# Patient Record
Sex: Female | Born: 1955 | Race: Black or African American | Hispanic: No | Smoking: Never smoker
Health system: Southern US, Community
[De-identification: ages and names within clinical notes are randomized; demographics above are authoritative.]

## PROBLEM LIST (undated history)

## (undated) DIAGNOSIS — I10 Essential (primary) hypertension: Secondary | ICD-10-CM

## (undated) DIAGNOSIS — E7800 Pure hypercholesterolemia, unspecified: Secondary | ICD-10-CM

## (undated) DIAGNOSIS — J45909 Unspecified asthma, uncomplicated: Secondary | ICD-10-CM

## (undated) DIAGNOSIS — R55 Syncope and collapse: Secondary | ICD-10-CM

## (undated) HISTORY — DX: Syncope and collapse: R55

---

## 2012-04-25 ENCOUNTER — Emergency Department (HOSPITAL_COMMUNITY)
Admission: EM | Admit: 2012-04-25 | Discharge: 2012-04-25 | Disposition: A | Discharge status: Home / Self Care | Payer: Self-pay | Attending: Emergency Medicine | Admitting: Emergency Medicine

## 2012-04-25 ENCOUNTER — Encounter (HOSPITAL_COMMUNITY): Payer: Self-pay | Admitting: *Deleted

## 2012-04-25 DIAGNOSIS — I10 Essential (primary) hypertension: Secondary | ICD-10-CM | POA: Insufficient documentation

## 2012-04-25 DIAGNOSIS — E7800 Pure hypercholesterolemia, unspecified: Secondary | ICD-10-CM | POA: Insufficient documentation

## 2012-04-25 DIAGNOSIS — Z79899 Other long term (current) drug therapy: Secondary | ICD-10-CM | POA: Insufficient documentation

## 2012-04-25 DIAGNOSIS — J45909 Unspecified asthma, uncomplicated: Secondary | ICD-10-CM | POA: Insufficient documentation

## 2012-04-25 DIAGNOSIS — Z760 Encounter for issue of repeat prescription: Secondary | ICD-10-CM | POA: Insufficient documentation

## 2012-04-25 HISTORY — DX: Unspecified asthma, uncomplicated: J45.909

## 2012-04-25 HISTORY — DX: Pure hypercholesterolemia, unspecified: E78.00

## 2012-04-25 HISTORY — DX: Essential (primary) hypertension: I10

## 2012-04-25 MED ORDER — LISINOPRIL-HYDROCHLOROTHIAZIDE 10-12.5 MG PO TABS: 1.0000 | ORAL_TABLET | Freq: Every day | ORAL | Status: DC

## 2012-04-25 NOTE — ED Notes (Signed)
Pt saw PCP today and was told that her BP was elevated. Pt has been without BP medication for a month. Pt states that she normally gets hypertension when she is off her medications. Pt PCP did not refill her BP medication but told her to come to the E.D. Pt is alert and oriented, no neuro deficits able to follow commands and move all extremities.

## 2012-04-25 NOTE — ED Provider Notes (Signed)
History    57 year old female who was apparently referred to the emergency room for evaluation of hypertension. She has been without her blood pressure medications for approximately one month. She takes lisinopril/hydrochlorothiazide. She has no complaints. Denies any headaches. No acute visual changes. No confusion. No numbness, tingling or loss of strength. No chest pain or shortness of breath. No urinary complaints. No unusual leg pain or swelling.  CSN: [id number]  Arrival date & time 04/25/12  4098   First MD Initiated Contact with Patient 04/25/12 2014      Chief Complaint  Patient presents with  . Hypertension  . Medication Refill    (Consider location/radiation/quality/duration/timing/severity/associated sxs/prior treatment) HPI  Past Medical History  Diagnosis Date  . Hypertension   . Asthma   . High cholesterol     Past Surgical History  Procedure Laterality Date  . Cesarean section      History reviewed. No pertinent family history.  History  Substance Use Topics  . Smoking status: Never Smoker   . Smokeless tobacco: Not on file  . Alcohol Use: No    OB History   Grav Para Term Preterm Abortions TAB SAB Ect Mult Living                  Review of Systems  All systems reviewed and negative, other than as noted in HPI.  Allergies  Review of patient's allergies indicates no known allergies.  Home Medications   Current Outpatient Rx  Name  Route  Sig  Dispense  Refill  . albuterol (PROVENTIL HFA;VENTOLIN HFA) 108 (90 BASE) MCG/ACT inhaler   Inhalation   Inhale 2 puffs into the lungs every 6 (six) hours as needed for wheezing. For shortness of breath         . LISINOPRIL PO   Oral   Take 1 tablet by mouth daily.         Marland Kitchen PRAVASTATIN SODIUM PO   Oral   Take 1 tablet by mouth at bedtime.           BP 208/95  Pulse 91  Temp(Src) 98.3 F (36.8 C) (Oral)  Resp 16  SpO2 96%  Physical Exam  Nursing note and vitals  reviewed. Constitutional: She appears well-developed and well-nourished. No distress.  HENT:  Head: Normocephalic and atraumatic.  Eyes: Conjunctivae are normal. Right eye exhibits no discharge. Left eye exhibits no discharge.  Neck: Neck supple.  Cardiovascular: Normal rate, regular rhythm and normal heart sounds.  Exam reveals no gallop and no friction rub.   No murmur heard. Pulmonary/Chest: Effort normal and breath sounds normal. No respiratory distress.  Abdominal: Soft. She exhibits no distension. There is no tenderness.  Musculoskeletal: She exhibits no edema and no tenderness.  Neurological: She is alert.  Skin: Skin is warm and dry.  Psychiatric: She has a normal mood and affect. Her behavior is normal. Thought content normal.    ED Course  Procedures (including critical care time)  Labs Reviewed - No data to display No results found.   1. Hypertension       MDM  57 year old female with asymptomatic hypertension. No indication for emergent reduction or workup at this time. Patient has been previously on lisinopril and hydrochlorothiazide. She cannot recall the doses though. She was started on a low dose of each. Discussed the need for primary care follow-up.       Raeford Razor, MD 04/27/12 1336

## 2014-01-16 ENCOUNTER — Emergency Department (HOSPITAL_COMMUNITY)
Admission: EM | Admit: 2014-01-16 | Discharge: 2014-01-17 | Disposition: A | Discharge status: Home / Self Care | Payer: Self-pay | Attending: Emergency Medicine | Admitting: Emergency Medicine

## 2014-01-16 ENCOUNTER — Encounter (HOSPITAL_COMMUNITY): Payer: Self-pay | Admitting: Adult Health

## 2014-01-16 DIAGNOSIS — R110 Nausea: Secondary | ICD-10-CM | POA: Insufficient documentation

## 2014-01-16 DIAGNOSIS — I10 Essential (primary) hypertension: Secondary | ICD-10-CM | POA: Insufficient documentation

## 2014-01-16 DIAGNOSIS — J45909 Unspecified asthma, uncomplicated: Secondary | ICD-10-CM | POA: Insufficient documentation

## 2014-01-16 DIAGNOSIS — Z8639 Personal history of other endocrine, nutritional and metabolic disease: Secondary | ICD-10-CM | POA: Insufficient documentation

## 2014-01-16 LAB — CBC
HEMATOCRIT: 37.2 % (ref 36.0–46.0)
Hemoglobin: 12.4 g/dL (ref 12.0–15.0)
MCH: 29.3 pg (ref 26.0–34.0)
MCHC: 33.3 g/dL (ref 30.0–36.0)
MCV: 87.9 fL (ref 78.0–100.0)
Platelets: 269 10*3/uL (ref 150–400)
RBC: 4.23 MIL/uL (ref 3.87–5.11)
RDW: 13.3 % (ref 11.5–15.5)
WBC: 3.8 10*3/uL — ABNORMAL LOW (ref 4.0–10.5)

## 2014-01-16 LAB — BASIC METABOLIC PANEL
Anion gap: 11 (ref 5–15)
BUN: 11 mg/dL (ref 6–23)
CO2: 26 mEq/L (ref 19–32)
CREATININE: 0.55 mg/dL (ref 0.50–1.10)
Calcium: 9.4 mg/dL (ref 8.4–10.5)
Chloride: 100 mEq/L (ref 96–112)
Glucose, Bld: 130 mg/dL — ABNORMAL HIGH (ref 70–99)
POTASSIUM: 3.6 meq/L — AB (ref 3.7–5.3)
Sodium: 137 mEq/L (ref 137–147)

## 2014-01-16 LAB — I-STAT TROPONIN, ED: Troponin i, poc: 0 ng/mL (ref 0.00–0.08)

## 2014-01-16 MED ORDER — LISINOPRIL 10 MG PO TABS
10.0000 mg | ORAL_TABLET | Freq: Once | ORAL | Status: AC
  Administered 2014-01-17: 10 mg via ORAL
  Filled 2014-01-16: qty 1

## 2014-01-16 MED ORDER — HYDROCHLOROTHIAZIDE 12.5 MG PO CAPS
12.5000 mg | ORAL_CAPSULE | Freq: Every day | ORAL | Status: DC
  Administered 2014-01-17: 12.5 mg via ORAL
  Filled 2014-01-16: qty 1

## 2014-01-16 NOTE — ED Provider Notes (Signed)
TIME SEEN: 11:10 PM  CHIEF COMPLAINT: nausea, lightheadedness  HPI: Pt is a 58 y.o. F with h/o HTN, HLD, asthma who presents to the ED with one day of nausea and lightheadedness now gone. She reports she has not been taking her lisinopril and hydrochlorothiazide for 8 months because she lost her job. [place] and does not have a primary care physician. States that she is feeling well currently. No headache, vision changes, chest pain or shortness of breath, numbness, tingling or focal weakness.  ROS: See HPI Constitutional: no fever  Eyes: no drainage  ENT: no runny nose   Cardiovascular:  no chest pain  Resp: no SOB  GI: no vomiting GU: no dysuria Integumentary: no rash  Allergy: no hives  Musculoskeletal: no leg swelling  Neurological: no slurred speech ROS otherwise negative  PAST MEDICAL HISTORY/PAST SURGICAL HISTORY:  Past Medical History  Diagnosis Date  . Hypertension   . Asthma   . High cholesterol     MEDICATIONS:  Prior to Admission medications   Medication Sig Start Date End Date Taking? Authorizing Provider  albuterol (PROVENTIL HFA;VENTOLIN HFA) 108 (90 BASE) MCG/ACT inhaler Inhale 2 puffs into the lungs every 6 (six) hours as needed for wheezing. For shortness of breath    Historical Provider, MD  lisinopril-hydrochlorothiazide (PRINZIDE) 10-12.5 MG per tablet Take 1 tablet by mouth daily. 04/25/12   Raeford RazorStephen Kohut, MD    ALLERGIES:  No Known Allergies  SOCIAL HISTORY:  History  Substance Use Topics  . Smoking status: Never Smoker   . Smokeless tobacco: Not on file  . Alcohol Use: No    FAMILY HISTORY: History reviewed. No pertinent family history.  EXAM: BP 211/103 mmHg  Pulse 71  Temp(Src) 97.8 F (36.6 C) (Oral)  Resp 17  SpO2 100% CONSTITUTIONAL: Alert and oriented and responds appropriately to questions. Well-appearing; well-nourished HEAD: Normocephalic EYES: Conjunctivae clear, PERRL; extraocular  movements intact, normal visual fields, patient has a small inferior and medial subconjunctival hemorrhage, no hyphema, no papilledema appreciated on funduscopic exam ENT: normal nose; no rhinorrhea; moist mucous membranes; pharynx without lesions noted NECK: Supple, no meningismus, no LAD  CARD: RRR; S1 and S2 appreciated; no murmurs, no clicks, no rubs, no gallops RESP: Normal chest excursion without splinting or tachypnea; breath sounds clear and equal bilaterally; no wheezes, no rhonchi, no rales,  ABD/GI: Normal bowel sounds; non-distended; soft, non-tender, no rebound, no guarding BACK:  The back appears normal and is non-tender to palpation, there is no CVA tenderness EXT: Normal ROM in all joints; non-tender to palpation; no edema; normal capillary refill; no cyanosis    SKIN: Normal color for age and race; warm NEURO: Moves all extremities equally; sensation to light touch intact diffusely, cranial nerves II through XII intact PSYCH: The patient's mood and manner are appropriate. Grooming and personal hygiene are appropriate.  MEDICAL DECISION MAKING: Patient here with vague symptoms of lightheadedness and nausea that have now resolved. She reports she is feeling better. She is still hypertensive with blood pressure of 194/106 but is asymptomatic. Will restart her lisinopril and hydrochlorothiazide. Labs ordered from triage show no signs of end organ damage. EKG shows no ischemic changes, LVH. Will obtain urinalysis and closely monitor blood pressure.  ED PROGRESS: Patient's urine shows no hematuria or proteinuria. Labs are unremarkable and show no other sign of end organ damage.  She is still mildly hypertensive asymptomatic. Discussed with patient that we do not need to acutely lower her  blood pressure too low as this is likely what her brain is used to and that we could cause her to have an infarct if we drop her blood pressure too quickly. Discussed with patient that she will need to get  her lisinopril and hydrochlorothiazide filled tomorrow. Discussed with her that both of these medications are on the [facility] $4 list even if she does not have insurance. Will give outpatient PCP follow-up information including [facility] so she can establish a PCP. Have discussed with her at length that it is dangerous to have elevated blood pressure for a significant period of time I recommend that she begin taking her medications regularly. Discussed return precautions. She verbalized understanding and is comfortable with plan.      EKG Interpretation  Date/Time:  Wednesday January 16 2014 21:09:07 EST Ventricular Rate:  73 PR Interval:  146 QRS Duration: 70 QT Interval:  372 QTC Calculation: 409 R Axis:   75 Text Interpretation:  Normal sinus rhythm Normal ECG No old tracing to compare Confirmed by Serria Sloma,  DO, Geralene Afshar [phone]([id number]) on 01/16/2014 11:13:20 PM        Layla MawKristen N Roberto Romanoski, DO 01/17/14 [id number]

## 2014-01-16 NOTE — ED Notes (Signed)
Presents with dizziness and nausea that began today-she has been out of BP medication for a few months and reports the dizziness feels "Like I am drunk"  Denies blurred vision, denies pain denies headache, denies numbness, tingling. Neurologically intact. BP here 202/95

## 2014-01-17 LAB — URINALYSIS, ROUTINE W REFLEX MICROSCOPIC
BILIRUBIN URINE: NEGATIVE
GLUCOSE, UA: NEGATIVE mg/dL
HGB URINE DIPSTICK: NEGATIVE
KETONES UR: NEGATIVE mg/dL
Leukocytes, UA: NEGATIVE
Nitrite: NEGATIVE
PH: 7.5 (ref 5.0–8.0)
Protein, ur: NEGATIVE mg/dL
Specific Gravity, Urine: 1.008 (ref 1.005–1.030)
Urobilinogen, UA: 0.2 mg/dL (ref 0.0–1.0)

## 2014-01-17 MED ORDER — LISINOPRIL 10 MG PO TABS: 10.0000 mg | ORAL_TABLET | Freq: Every day | ORAL | Status: DC

## 2014-01-17 MED ORDER — HYDROCHLOROTHIAZIDE 12.5 MG PO TABS: 12.5000 mg | ORAL_TABLET | Freq: Every day | ORAL | Status: DC

## 2014-01-17 NOTE — Discharge Instructions (Signed)
DASH Eating Plan °DASH stands for "Dietary Approaches to Stop Hypertension." The DASH eating plan is a healthy eating plan that has been shown to reduce high blood pressure (hypertension). Additional health benefits may include reducing the risk of type 2 diabetes mellitus, heart disease, and stroke. The DASH eating plan may also help with weight loss. °WHAT DO I NEED TO KNOW ABOUT THE DASH EATING PLAN? °For the DASH eating plan, you will follow these general guidelines: °· Choose foods with a percent daily value for sodium of less than 5% (as listed on the food label). °· Use salt-free seasonings or herbs instead of table salt or sea salt. °· Check with your health care provider or pharmacist before using salt substitutes. °· Eat lower-sodium products, often labeled as "lower sodium" or "no salt added." °· Eat fresh foods. °· Eat more vegetables, fruits, and low-fat dairy products. °· Choose whole grains. Look for the word "whole" as the first word in the ingredient list. °· Choose fish and skinless chicken or turkey more often than red meat. Limit fish, poultry, and meat to 6 oz (170 g) each day. °· Limit sweets, desserts, sugars, and sugary drinks. °· Choose heart-healthy fats. °· Limit cheese to 1 oz (28 g) per day. °· Eat more home-cooked food and less restaurant, buffet, and fast food. °· Limit fried foods. °· Cook foods using methods other than frying. °· Limit canned vegetables. If you do use them, rinse them well to decrease the sodium. °· When eating at a restaurant, ask that your food be prepared with less salt, or no salt if possible. °WHAT FOODS CAN I EAT? °Seek help from a dietitian for individual calorie needs. °Grains °Whole grain or whole wheat bread. Brown rice. Whole grain or whole wheat pasta. Quinoa, bulgur, and whole grain cereals. Low-sodium cereals. Corn or whole wheat flour tortillas. Whole grain cornbread. Whole grain crackers. Low-sodium crackers. °Vegetables °Fresh or frozen vegetables  (raw, steamed, roasted, or grilled). Low-sodium or reduced-sodium tomato and vegetable juices. Low-sodium or reduced-sodium tomato sauce and paste. Low-sodium or reduced-sodium canned vegetables.  °Fruits °All fresh, canned (in natural juice), or frozen fruits. °Meat and Other Protein Products °Ground beef (85% or leaner), grass-fed beef, or beef trimmed of fat. Skinless chicken or turkey. Ground chicken or turkey. Pork trimmed of fat. All fish and seafood. Eggs. Dried beans, peas, or lentils. Unsalted nuts and seeds. Unsalted canned beans. °Dairy °Low-fat dairy products, such as skim or 1% milk, 2% or reduced-fat cheeses, low-fat ricotta or cottage cheese, or plain low-fat yogurt. Low-sodium or reduced-sodium cheeses. °Fats and Oils °Tub margarines without trans fats. Light or reduced-fat mayonnaise and salad dressings (reduced sodium). Avocado. Safflower, olive, or canola oils. Natural peanut or almond butter. °Other °Unsalted popcorn and pretzels. °The items listed above may not be a complete list of recommended foods or beverages. Contact your dietitian for more options. °WHAT FOODS ARE NOT RECOMMENDED? °Grains °White bread. White pasta. White rice. Refined cornbread. Bagels and croissants. Crackers that contain trans fat. °Vegetables °Creamed or fried vegetables. Vegetables in a cheese sauce. Regular canned vegetables. Regular canned tomato sauce and paste. Regular tomato and vegetable juices. °Fruits °Dried fruits. Canned fruit in light or heavy syrup. Fruit juice. °Meat and Other Protein Products °Fatty cuts of meat. Ribs, chicken wings, bacon, sausage, bologna, salami, chitterlings, fatback, hot dogs, bratwurst, and packaged luncheon meats. Salted nuts and seeds. Canned beans with salt. °Dairy °Whole or 2% milk, cream, half-and-half, and cream cheese. Whole-fat or sweetened yogurt. Full-fat   cheeses or blue cheese. Nondairy creamers and whipped toppings. Processed cheese, cheese spreads, or cheese  curds. °Condiments °Onion and garlic salt, seasoned salt, table salt, and sea salt. Canned and packaged gravies. Worcestershire sauce. Tartar sauce. Barbecue sauce. Teriyaki sauce. Soy sauce, including reduced sodium. Steak sauce. Fish sauce. Oyster sauce. Cocktail sauce. Horseradish. Ketchup and mustard. Meat flavorings and tenderizers. Bouillon cubes. Hot sauce. Tabasco sauce. Marinades. Taco seasonings. Relishes. °Fats and Oils °Butter, stick margarine, lard, shortening, ghee, and bacon fat. Coconut, palm kernel, or palm oils. Regular salad dressings. °Other °Pickles and olives. Salted popcorn and pretzels. °The items listed above may not be a complete list of foods and beverages to avoid. Contact your dietitian for more information. °WHERE CAN I FIND MORE INFORMATION? °National Heart, Lung, and Blood Institute: www.nhlbi.nih.gov/health/health-topics/topics/dash/ °Document Released: 01/14/2011 Document Revised: 06/11/2013 Document Reviewed: 11/29/2012 °ExitCare® Patient Information ©2015 [employer]. This information is not intended to replace advice given to you by your health care provider. Make sure you discuss any questions you have with your health care provider. ° °Hypertension °Hypertension, commonly called high blood pressure, is when the force of blood pumping through your arteries is too strong. Your arteries are the blood vessels that carry blood from your heart throughout your body. A blood pressure reading consists of a higher number over a lower number, such as 110/72. The higher number (systolic) is the pressure inside your arteries when your heart pumps. The lower number (diastolic) is the pressure inside your arteries when your heart relaxes. Ideally you want your blood pressure below 120/80. °Hypertension forces your heart to work harder to pump blood. Your arteries may become narrow or stiff. Having hypertension puts you at risk for heart disease, stroke, and other problems.  °RISK  FACTORS °Some risk factors for high blood pressure are controllable. Others are not.  °Risk factors you cannot control include:  °· Race. You may be at higher risk if you are African American. °· Age. Risk increases with age. °· Gender. Men are at higher risk than women before age 45 years. After age 65, women are at higher risk than men. °Risk factors you can control include: °· Not getting enough exercise or physical activity. °· Being overweight. °· Getting too much fat, sugar, calories, or salt in your diet. °· Drinking too much alcohol. °SIGNS AND SYMPTOMS °Hypertension does not usually cause signs or symptoms. Extremely high blood pressure (hypertensive crisis) may cause headache, anxiety, shortness of breath, and nosebleed. °DIAGNOSIS  °To check if you have hypertension, your health care provider will measure your blood pressure while you are seated, with your arm held at the level of your heart. It should be measured at least twice using the same arm. Certain conditions can cause a difference in blood pressure between your right and left arms. A blood pressure reading that is higher than normal on one occasion does not mean that you need treatment. If one blood pressure reading is high, ask your health care provider about having it checked again. °TREATMENT  °Treating high blood pressure includes making lifestyle changes and possibly taking medicine. Living a healthy lifestyle can help lower high blood pressure. You may need to change some of your habits. °Lifestyle changes may include: °· Following the DASH diet. This diet is high in fruits, vegetables, and whole grains. It is low in salt, red meat, and added sugars. °· Getting at least 2½ hours of brisk physical activity every week. °· Losing weight if necessary. °· Not smoking. °·   Limiting alcoholic beverages. °· Learning ways to reduce stress. ° If lifestyle changes are not enough to get your blood pressure under control, your health care provider may  prescribe medicine. You may need to take more than one. Work closely with your health care provider to understand the risks and benefits. °HOME CARE INSTRUCTIONS °· Have your blood pressure rechecked as directed by your health care provider.   °· Take medicines only as directed by your health care provider. Follow the directions carefully. Blood pressure medicines must be taken as prescribed. The medicine does not work as well when you skip doses. Skipping doses also puts you at risk for problems.   °· Do not smoke.   °· Monitor your blood pressure at home as directed by your health care provider.  °SEEK MEDICAL CARE IF:  °· You think you are having a reaction to medicines taken. °· You have recurrent headaches or feel dizzy. °· You have swelling in your ankles. °· You have trouble with your vision. °SEEK IMMEDIATE MEDICAL CARE IF: °· You develop a severe headache or confusion. °· You have unusual weakness, numbness, or feel faint. °· You have severe chest or abdominal pain. °· You vomit repeatedly. °· You have trouble breathing. °MAKE SURE YOU:  °· Understand these instructions. °· Will watch your condition. °· Will get help right away if you are not doing well or get worse. °Document Released: 01/25/2005 Document Revised: 06/11/2013 Document Reviewed: 11/17/2012 °ExitCare® Patient Information ©2015 [employer]. This information is not intended to replace advice given to you by your health care provider. Make sure you discuss any questions you have with your health care provider. ° °Emergency Department Resource Guide °1) Find a Doctor and Pay Out of Pocket °Although you won't have to find out who is covered by your insurance plan, it is a good idea to ask around and get recommendations. You will then need to call the office and see if the doctor you have chosen will accept you as a new patient and what types of options they offer for patients who are self-pay. Some doctors offer discounts or will set up payment  plans for their patients who do not have insurance, but you will need to ask so you aren't surprised when you get to your appointment. ° °2) Contact Your Local Health Department °Not all health departments have doctors that can see patients for sick visits, but many do, so it is worth a call to see if yours does. If you don't know where your local health department is, you can check in your phone book. The CDC also has a tool to help you locate your state's health department, and many state websites also have listings of all of their local health departments. ° °3) Find a Walk-in Clinic °If your illness is not likely to be very severe or complicated, you may want to try a walk in clinic. These are popping up all over the country in pharmacies, drugstores, and shopping centers. They're usually staffed by nurse practitioners or physician assistants that have been trained to treat common illnesses and complaints. They're usually fairly quick and inexpensive. However, if you have serious medical issues or chronic medical problems, these are probably not your best option. ° °No Primary Care Doctor: °- Call [facility] at  832-8000 - they can help you locate a primary care doctor that  accepts your insurance, provides certain services, etc. °- Physician Referral Service- 1-800-533-3463 ° °Chronic Pain Problems: °Organization           Address  Phone   Notes  °[facility]  (336) [phone] Patients need to be referred by their primary care doctor.  ° °Medication Assistance: °Organization         Address  Phone   Notes  °[place] [facility] [address] °[place], [place] [address] (336) 641-8030 --Must be a resident of [place] °-- Must have NO insurance coverage whatsoever (no Medicaid/ Medicare, etc.) °-- The pt. MUST have a primary care doctor that directs their care regularly and follows them in the community °  °[facility]  (866) 331-1348   °[employer]   (888) 892-1162   ° °Agencies that provide inexpensive medical care: °Organization         Address  Phone   Notes  °[facility]  (336) 832-8035   °[facility]    (336) 832-7272   °[facility] [address] °[place], [place] [address] (336) 832-4777   °[facility] [address], °[place] (336) 271-4999   °[facility]    (336) 373-0678   °[facility]    (336) 272-1050   °[facility] ° [address], [place] Phone:  (336) 832-4444, Fax:  (336) 832-4440 Hours of Operation:  9 am - 6 pm, M-F.  Also accepts Medicaid/Medicare and self-pay.  °[facility] ° [address], [place] Phone: (336) [phone], Fax: (336) [phone]. Hours of Operation:  8:30 am - 5:30 pm, M-F.  Also accepts Medicaid and self-pay.  °[facility] [place] [address], [place] Phone: (336) 878-6027   °[facility] [address], [place], [place] (336)723-1848, Ext. 123 Mondays & Thursdays: 7-9 AM.  First 15 patients are seen on a first come, first serve basis. °  ° °Medicaid-accepting [place] Providers: ° °Organization         Address  Phone   Notes  °[facility] [address], [place] (336) [phone] Also accepts self-pay patients.  °[facility] [address], [place] ° (336) 856-9996   °[facility] [address], [place] (336) 288-8857   °[facility] [address] [place] [address], [place] (336) 299-7000   °Veita Bland [address], [place]  ° (336) [phone] Only accepts [place] Access Medicaid patients after they have their name applied to their card.  ° °Self-Pay (no insurance) in [place]: ° °Organization         Address  Phone   Notes  °Sickle Cell Patients, [facility] [address], [place] (336)  832-1970   °[facility] [address], [place] (336) 832-4400   °[facility] ° [address], [place] (336) 992-4800   °[facility]/Dr. Osei-Bonsu ° [address] [place] [address], [place] or [address], [place] (336) [phone] Phone number for both [place] and [place] locations is the same.  °[facility] [address], [place] (336) 299-0000   °[facility] [address] [place] [address], [place] or [address] (336) 852-7530 °(336) 878-2260   °[facility] [address], [place] (336) 350-1642, phone; (336) 294-5005, fax Sees patients 1st and 3rd Saturday of every month.  Must not qualify   for public or private insurance (i.e. Medicaid, Medicare, [place] Health Choice, Veterans' Benefits) • Household income should be no more than 200% of the poverty level •The clinic cannot treat you if you are pregnant or think you are pregnant • Sexually transmitted diseases are not treated at the clinic.  ° ° °Dental Care: °Organization         Address  Phone  Notes  °[facility] [address], [place] (336) 641-6152 Accepts children up to age 21 who are enrolled in Medicaid or [place] Health Choice; pregnant women with a Medicaid card; and children who have applied for Medicaid or [place] Health Choice, but were declined, whose parents can pay a reduced fee at time of service.  °[facility] [place]  [address], [place] (336) 641-7733 Accepts children up to age 21 who are enrolled in Medicaid or [place] Health Choice; pregnant women with a Medicaid card; and children who have applied for Medicaid or [place] Health Choice, but were declined, whose parents can pay a reduced fee at time of service.  °[facility] ° [address], [place] (336) 641-4533 Patients are  seen by appointment only. Walk-ins are not accepted. [facility] will see patients 18 years of age and older. °Monday - Tuesday (8am-5pm) °Most Wednesdays (8:30-5pm) °$30 per visit, cash only  °[facility] ° [address], [place] (336) 641-4533 Patients are seen by appointment only. Walk-ins are not accepted. [facility] will see patients 18 years of age and older. °One Wednesday Evening (Monthly: Volunteer Based).  $30 per visit, cash only  °[facility]  (919) [phone] for adults; Children under age 4, call Graduate Pediatric Dentistry at ([phone]) [phone]. Children aged 4-14, please call ([phone]) [phone] to request a pediatric application. ° Dental services are provided in all areas of dental care including fillings, crowns and bridges, complete and partial dentures, implants, gum treatment, root canals, and extractions. Preventive care is also provided. Treatment is provided to both adults and children. °Patients are selected via a lottery and there is often a waiting list. °  °[facility] [address], °[place] ° (336) 763-8833 [email or web address] °  °[facility] [address], [place], [place] (336)723-1848, Ext. 123 Second and Fourth Thursday of each month, opens at 6:30 AM; Clinic ends at 9 AM.  Patients are seen on a first-come first-served basis, and a limited number are seen during each clinic.  ° °[facility] ° [address], [place], [place] (336) 723-7904   Eligibility Requirements °You must have lived in [place], or [place] counties for at least the last three months. °  You cannot be eligible for state or federal sponsored healthcare insurance, including Veterans Administration, Medicaid, or Medicare. °  You generally cannot be eligible for healthcare insurance through your employer.  °  How to apply: °Eligibility screenings are held every Tuesday and Wednesday afternoon from 1:00 pm until 4:00  pm. You do not need an appointment for the interview!  °[facility] [address], [place], [place] 336-631-2330   °[facility] [facility]  336-342-8273   °[facility]  336-703-3100   °[facility]  336-570-6415   ° °Behavioral Health Resources in the Community: °Intensive Outpatient Programs °Organization         Address  Phone    Notes  °[facility] [address], [place], [place] 336-878-6098   °[facility] [address], [place], [place] 336-832-9800   °[facility] [address], [place], [place] ° 336-882-2125   °[facility] [address],  °[place], [place] 1-800-853-5163 or 336-641-4981   °Substance Abuse Resources °Organization         Address  Phone  Notes  °[facility]  336-882-2125   °[facility]  336-784-9470   °[facility]  336-285-9073   °[facility]  336-845-3988   °Residential & Outpatient Substance Abuse Program  1-800-659-3381   °Psychological Services °Organization         Address  Phone  Notes  °[facility]  336- 832-9600   °[facility]  336- 378-7881   °[facility] [address], [place] 1-800-853-5163 or 336-641-4981   ° °Mobile Crisis Teams °Organization         Address  Phone  Notes  °[facility], Mobile Crisis Care Unit  1-877-626-1772   °Assertive °[facility] ° [address] [place], [place] 336-834-9664   °Sharon DeEsch [address] °[place] 336-554-5454   ° °Self-Help/Support Groups °Organization         Address  Phone             Notes  °[facility] - variety of support groups  336- 373-1402 Call for more information  °Narcotics Anonymous (NA), [facility] [address], °[place]  2 meetings at this location  ° °Residential Treatment Programs °Organization          Address  Phone  Notes  °[facility] [address],    °[place]  1-866-801-8205   °[facility] ° [address], [place], [place] 704-293-8524   °[facility] [address], [place] 336-845-3988 Admissions: 8am-3pm M-F  °[facility] [address]    °[place], [place] 336-841-1104   °[facility] [address], [place], [place] 336-379-7146   °[facility] [address]  °[place], [place] 336-285-9073   °[facility] - Intensive Outpatient [address], [place], [place] 336-852-3033   °[facility] [address]  °[place], [place] 1-877-615-2722 or 336-784-9470   °[facility] [address] [place], [place] 336-227-7417 Accepts Medicaid  °[facility] [address]  °[place] 1-800-659-3381 Substance Abuse/Addiction Treatment  ° °[place] Behavioral Health Resources °Organization         Address  Phone  Notes  °[facility]  (888) 581-9988   °Julie Brannon, PhD [address] [place], [place]   (336) 349-5553 or (336) 951-0000   °[facility]   [address] °[place], [place] (336) 349-4454   °[facility] [address], [place], [place] (336) 342-8316 Insurance/Medicaid/sponsorship through [facility]  °[facility] [address]                                    [place],  (336) 342-8316 Therapy/tele-psych/case  °[facility] [address]  ° [place],  (336) 349-2233    °Dr. Arfeen  (336) 349-4544   °[facility] [place]  [employer] [place]   [facility] 1) [address], [place] °2) [address], [place] °3)  [address], [place] (336) 349-3220 °(336) 342-7768 ° °(336) 342-8140   °[place] [facility] (336) 342-1394 or (336) 342-3537 (After Hours)    ° ° °

## 2014-01-17 NOTE — ED Notes (Signed)
Signature pad in room not working, pt reports she understands discharge instructions and has no other questions.  

## 2014-07-24 ENCOUNTER — Emergency Department (INDEPENDENT_AMBULATORY_CARE_PROVIDER_SITE_OTHER)
Admission: EM | Admit: 2014-07-24 | Discharge: 2014-07-24 | Disposition: A | Discharge status: Home / Self Care | Payer: Self-pay | Source: Home / Self Care | Attending: Emergency Medicine | Admitting: Emergency Medicine

## 2014-07-24 ENCOUNTER — Encounter (HOSPITAL_COMMUNITY): Payer: Self-pay | Admitting: Emergency Medicine

## 2014-07-24 DIAGNOSIS — M545 Low back pain, unspecified: Secondary | ICD-10-CM

## 2014-07-24 DIAGNOSIS — I10 Essential (primary) hypertension: Secondary | ICD-10-CM

## 2014-07-24 DIAGNOSIS — S76311A Strain of muscle, fascia and tendon of the posterior muscle group at thigh level, right thigh, initial encounter: Secondary | ICD-10-CM

## 2014-07-24 LAB — POCT I-STAT, CHEM 8
BUN: 16 mg/dL (ref 6–20)
CALCIUM ION: 1.28 mmol/L (ref 1.13–1.30)
Chloride: 104 mmol/L (ref 101–111)
Creatinine, Ser: 0.7 mg/dL (ref 0.44–1.00)
Glucose, Bld: 93 mg/dL (ref 65–99)
HEMATOCRIT: 39 % (ref 36.0–46.0)
Hemoglobin: 13.3 g/dL (ref 12.0–15.0)
Potassium: 4.3 mmol/L (ref 3.5–5.1)
SODIUM: 145 mmol/L (ref 135–145)
TCO2: 27 mmol/L (ref 0–100)

## 2014-07-24 MED ORDER — NAPROXEN 500 MG PO TABS: 500.0000 mg | ORAL_TABLET | Freq: Two times a day (BID) | ORAL | Status: DC

## 2014-07-24 MED ORDER — LISINOPRIL-HYDROCHLOROTHIAZIDE 10-12.5 MG PO TABS: 1.0000 | ORAL_TABLET | Freq: Every day | ORAL | Status: DC

## 2014-07-24 NOTE — Discharge Instructions (Signed)
You pulled a muscle in your right leg.  This is why your back hurts as well. Apply heat to your leg. Alternate ice and heat to your back. Take Naprosyn twice a day for the next week, then as needed. You should see improvement in the next week, but this will take several weeks to fully heal.  I have refilled your blood pressure medication. Come back in one week to get your blood work rechecked. Please call our office tomorrow between 1 and 5 PM and asked to speak to [facility] about insurance options.

## 2014-07-24 NOTE — ED Provider Notes (Signed)
CSN: [id number]     Arrival date & time 07/24/14  1847 History   First MD Initiated Contact with Patient 07/24/14 1904     Chief Complaint  Patient presents with  . Leg Pain  . Back Pain  . Medication Refill    pt out of BP meds for the past 2 months.    (Consider location/radiation/quality/duration/timing/severity/associated sxs/prior Treatment) HPI  She is a 59 year old woman here for evaluation of leg pain and back pain. She states she was cleaning out her yard a week ago and the next day started having pain in the back of her right knee. It is worse when she is driving a car. It is also painful with walking. She has tried ice and Epsom salts soaks with some improvement. Yesterday, she started developing pain across her lower back. Nothing seems to make this pain better or worse.  She also needs a refill of her blood pressure medications. She has been out of them for 2 months. She does not have a regular doctor. She gets her blood pressure medication from the emergency room.  Denies chest pain or shortness of breath.  Past Medical History  Diagnosis Date  . Hypertension   . Asthma   . High cholesterol    Past Surgical History  Procedure Laterality Date  . Cesarean section     History reviewed. No pertinent family history. History  Substance Use Topics  . Smoking status: Never Smoker   . Smokeless tobacco: Not on file  . Alcohol Use: No   OB History    No data available     Review of Systems As in history of present illness Allergies  Review of patient's allergies indicates no known allergies.  Home Medications   Prior to Admission medications   Medication Sig Start Date End Date Taking? Authorizing Provider  albuterol (PROVENTIL HFA;VENTOLIN HFA) 108 (90 BASE) MCG/ACT inhaler Inhale 2 puffs into the lungs every 6 (six) hours as needed for wheezing. For shortness of breath    Historical Provider, MD  lisinopril-hydrochlorothiazide (PRINZIDE) 10-12.5 MG per tablet Take  1 tablet by mouth daily. 07/24/14   Charm Rings, MD  naproxen (NAPROSYN) 500 MG tablet Take 1 tablet (500 mg total) by mouth 2 (two) times daily. For 1 week, then as needed 07/24/14   Charm Rings, MD   BP 196/89 mmHg  Pulse 93  Temp(Src) 97.7 F (36.5 C) (Oral)  Resp 16  SpO2 96% Physical Exam  Constitutional: She is oriented to person, place, and time. She appears well-developed and well-nourished. No distress.  Cardiovascular: Normal rate.   Pulmonary/Chest: Effort normal.  Musculoskeletal:  Back: No erythema or edema. No vertebral tenderness or step-offs. No point tenderness. Right knee: No erythema or edema. No joint effusion. She is tender at the distal hamstrings.  Neurological: She is alert and oriented to person, place, and time.    ED Course  Procedures (including critical care time) Labs Review Labs Reviewed  POCT I-STAT, CHEM 8    Imaging Review No results found.   MDM   1. Essential hypertension   2. Hamstring strain, right, initial encounter   3. Bilateral low back pain without sciatica    I have refilled her lisinopril-HCTZ 10-12 0.5 mg tablets. I have instructed her to call our office tomorrow and asked to speak to [facility] about assistance getting a primary care doctor. Conservative management of muscular strain with heat and Naprosyn. Follow-up in one week to recheck blood work.  Charm Rings, MD 07/24/14 2041

## 2014-07-24 NOTE — ED Notes (Signed)
C/o  Lower back pain that radiates down right leg.  Most of the pain is felt behind the right knee.   Pt has used aleve with mild relief.   Denies injury.   States "I did a lot of heavy lifting last week".

## 2014-11-14 ENCOUNTER — Telehealth: Payer: Self-pay

## 2014-11-14 NOTE — Telephone Encounter (Signed)
Never been seen here

## 2014-11-14 NOTE — Telephone Encounter (Signed)
Patient is calling to request a refill for blood pressure pills

## 2014-11-19 NOTE — ED Notes (Signed)
Patient called , asking for refill of her BP medication. Discussed w Dr Piedad Climes who denies this request, patinet was advised she will need to be seen here or at [facility] clinic for recheck

## 2015-01-06 ENCOUNTER — Encounter (HOSPITAL_COMMUNITY): Payer: Self-pay | Admitting: Emergency Medicine

## 2015-01-06 ENCOUNTER — Emergency Department (INDEPENDENT_AMBULATORY_CARE_PROVIDER_SITE_OTHER)
Admission: EM | Admit: 2015-01-06 | Discharge: 2015-01-06 | Disposition: A | Discharge status: Home / Self Care | Payer: Self-pay | Source: Home / Self Care | Attending: Family Medicine | Admitting: Family Medicine

## 2015-01-06 DIAGNOSIS — I10 Essential (primary) hypertension: Secondary | ICD-10-CM

## 2015-01-06 DIAGNOSIS — J4521 Mild intermittent asthma with (acute) exacerbation: Secondary | ICD-10-CM

## 2015-01-06 MED ORDER — IPRATROPIUM-ALBUTEROL 0.5-2.5 (3) MG/3ML IN SOLN
RESPIRATORY_TRACT | Status: AC
  Filled 2015-01-06: qty 3

## 2015-01-06 MED ORDER — METHYLPREDNISOLONE SODIUM SUCC 125 MG IJ SOLR
125.0000 mg | Freq: Once | INTRAMUSCULAR | Status: AC
  Administered 2015-01-06: 125 mg via INTRAMUSCULAR

## 2015-01-06 MED ORDER — ALBUTEROL SULFATE (2.5 MG/3ML) 0.083% IN NEBU
INHALATION_SOLUTION | RESPIRATORY_TRACT | Status: AC
  Filled 2015-01-06: qty 6

## 2015-01-06 MED ORDER — METHYLPREDNISOLONE SODIUM SUCC 125 MG IJ SOLR
INTRAMUSCULAR | Status: AC
  Filled 2015-01-06: qty 2

## 2015-01-06 MED ORDER — IPRATROPIUM BROMIDE 0.02 % IN SOLN
0.5000 mg | Freq: Once | RESPIRATORY_TRACT | Status: AC
  Administered 2015-01-06: 0.5 mg via RESPIRATORY_TRACT

## 2015-01-06 MED ORDER — ALBUTEROL SULFATE HFA 108 (90 BASE) MCG/ACT IN AERS: 2.0000 | INHALATION_SPRAY | Freq: Four times a day (QID) | RESPIRATORY_TRACT | Status: DC | PRN

## 2015-01-06 MED ORDER — ALBUTEROL SULFATE (2.5 MG/3ML) 0.083% IN NEBU
5.0000 mg | INHALATION_SOLUTION | Freq: Once | RESPIRATORY_TRACT | Status: AC
  Administered 2015-01-06: 5 mg via RESPIRATORY_TRACT

## 2015-01-06 MED ORDER — LISINOPRIL-HYDROCHLOROTHIAZIDE 10-12.5 MG PO TABS: 1.0000 | ORAL_TABLET | Freq: Every day | ORAL | Status: DC

## 2015-01-06 NOTE — ED Notes (Signed)
Reports sob for 3 weeks, ran out of albuterol.

## 2015-01-06 NOTE — Discharge Instructions (Signed)
Use your medicine as prescribed.

## 2015-01-06 NOTE — ED Provider Notes (Signed)
CSN: [id number]     Arrival date & time 01/06/15  1534 History   First MD Initiated Contact with Patient 01/06/15 1732     Chief Complaint  Patient presents with  . Medication Refill  . Shortness of Breath   (Consider location/radiation/quality/duration/timing/severity/associated sxs/prior Treatment) Patient is a 59 y.o. female presenting with shortness of breath. The history is provided by the patient.  Shortness of Breath Severity:  Mild Onset quality:  Gradual Duration:  3 weeks Timing:  Intermittent Progression:  Improving Chronicity:  Chronic Relieved by:  Inhaler Associated symptoms: wheezing   Associated symptoms: no abdominal pain, no chest pain, no cough, no fever and no PND   Risk factors comment:  Also no bp med for 1 mo. due to loss of job and medicaid.   Past Medical History  Diagnosis Date  . Hypertension   . Asthma   . High cholesterol    Past Surgical History  Procedure Laterality Date  . Cesarean section     No family history on file. Social History  Substance Use Topics  . Smoking status: Never Smoker   . Smokeless tobacco: None  . Alcohol Use: No   OB History    No data available     Review of Systems  Constitutional: Negative.  Negative for fever.  HENT: Negative.   Respiratory: Positive for shortness of breath and wheezing. Negative for cough.   Cardiovascular: Negative for chest pain and PND.  Gastrointestinal: Negative for abdominal pain.  All other systems reviewed and are negative.   Allergies  Review of patient's allergies indicates no known allergies.  Home Medications   Prior to Admission medications   Medication Sig Start Date End Date Taking? Authorizing Provider  albuterol (PROVENTIL HFA;VENTOLIN HFA) 108 (90 BASE) MCG/ACT inhaler Inhale 2 puffs into the lungs every 6 (six) hours as needed for wheezing or shortness of breath. 01/06/15   Linna Hoff, MD  lisinopril-hydrochlorothiazide (PRINZIDE,ZESTORETIC) 10-12.5 MG tablet  Take 1 tablet by mouth daily. 01/06/15   Linna Hoff, MD  naproxen (NAPROSYN) 500 MG tablet Take 1 tablet (500 mg total) by mouth 2 (two) times daily. For 1 week, then as needed 07/24/14   Charm Rings, MD   Meds Ordered and Administered this Visit   Medications  methylPREDNISolone sodium succinate (SOLU-MEDROL) 125 mg/2 mL injection 125 mg (not administered)  albuterol (PROVENTIL) (2.5 MG/3ML) 0.083% nebulizer solution 5 mg (not administered)  ipratropium (ATROVENT) nebulizer solution 0.5 mg (not administered)    BP 206/119 mmHg  Pulse 90  Temp(Src) 98.2 F (36.8 C) (Oral)  Resp 16  SpO2 99% No data found.   Physical Exam  Constitutional: She is oriented to person, place, and time. She appears well-developed and well-nourished.  HENT:  Mouth/Throat: Oropharynx is clear and moist.  Eyes: Pupils are equal, round, and reactive to light.  Neck: Normal range of motion. Neck supple.  Cardiovascular: Regular rhythm.   Pulmonary/Chest: Effort normal and breath sounds normal.  Musculoskeletal: She exhibits no edema.  Lymphadenopathy:    She has no cervical adenopathy.  Neurological: She is alert and oriented to person, place, and time.  Skin: Skin is warm and dry.  Nursing note and vitals reviewed.   ED Course  Procedures (including critical care time)  Labs Review Labs Reviewed - No data to display  Imaging Review No results found.   Visual Acuity Review  Right Eye Distance:   Left Eye Distance:   Bilateral Distance:    Right  Eye Near:   Left Eye Near:    Bilateral Near:         MDM   1. Asthma exacerbation attacks, mild intermittent   2. Essential hypertension        Linna HoffJames D Duong Haydel, MD 01/06/15 1752

## 2015-01-06 NOTE — ED Notes (Signed)
Breathing treatment in progress.  Treatment not finished, discharge will be delayed

## 2015-01-07 MED ORDER — ALBUTEROL SULFATE 108 (90 BASE) MCG/ACT IN AEPB: 2.0000 | INHALATION_SPRAY | RESPIRATORY_TRACT | Status: DC | PRN

## 2015-01-07 NOTE — ED Notes (Signed)
Patient called to inquire if there were something we can do to help her acquire a less expensive MDI, as she cannot afford the albuterol Rx yesterday. Discussed w Dr Randal BubaErin Honig, [facility] Patient will need to come into pick up a coupon to get a free MDI. Patient verbalized understanding of plan

## 2015-04-24 ENCOUNTER — Emergency Department (INDEPENDENT_AMBULATORY_CARE_PROVIDER_SITE_OTHER)
Admission: EM | Admit: 2015-04-24 | Discharge: 2015-04-24 | Disposition: A | Discharge status: Home / Self Care | Payer: Self-pay | Source: Home / Self Care | Attending: Family Medicine | Admitting: Family Medicine

## 2015-04-24 ENCOUNTER — Encounter (HOSPITAL_COMMUNITY): Payer: Self-pay | Admitting: Emergency Medicine

## 2015-04-24 ENCOUNTER — Emergency Department (INDEPENDENT_AMBULATORY_CARE_PROVIDER_SITE_OTHER): Payer: Self-pay

## 2015-04-24 DIAGNOSIS — I10 Essential (primary) hypertension: Secondary | ICD-10-CM

## 2015-04-24 DIAGNOSIS — R0989 Other specified symptoms and signs involving the circulatory and respiratory systems: Secondary | ICD-10-CM

## 2015-04-24 DIAGNOSIS — R05 Cough: Secondary | ICD-10-CM

## 2015-04-24 DIAGNOSIS — R059 Cough, unspecified: Secondary | ICD-10-CM

## 2015-04-24 LAB — POCT I-STAT, CHEM 8
BUN: 17 mg/dL (ref 6–20)
CALCIUM ION: 1.2 mmol/L (ref 1.13–1.30)
CHLORIDE: 105 mmol/L (ref 101–111)
Creatinine, Ser: 0.6 mg/dL (ref 0.44–1.00)
GLUCOSE: 79 mg/dL (ref 65–99)
HCT: 42 % (ref 36.0–46.0)
Hemoglobin: 14.3 g/dL (ref 12.0–15.0)
POTASSIUM: 3.9 mmol/L (ref 3.5–5.1)
Sodium: 144 mmol/L (ref 135–145)
TCO2: 29 mmol/L (ref 0–100)

## 2015-04-24 MED ORDER — CETIRIZINE HCL 10 MG PO TABS: 10.0000 mg | ORAL_TABLET | Freq: Every day | ORAL | Status: AC

## 2015-04-24 MED ORDER — LISINOPRIL-HYDROCHLOROTHIAZIDE 10-12.5 MG PO TABS: 1.0000 | ORAL_TABLET | Freq: Every day | ORAL | Status: DC

## 2015-04-24 MED ORDER — ALBUTEROL SULFATE HFA 108 (90 BASE) MCG/ACT IN AERS: 2.0000 | INHALATION_SPRAY | Freq: Four times a day (QID) | RESPIRATORY_TRACT | Status: DC | PRN

## 2015-04-24 NOTE — Discharge Instructions (Signed)
It is a pleasure to see you today.  Your chest x-ray, ECG and lab work do not explain your cough.   I believe your cough may be related to nasal discharge and seasonal allergies.  I recommend taking CETIRIZINE 10mg  tablets, take 1 tablet by mouth one time daily in the morning, for the cough.   You may use the albuterol puffer 2 puffs by mouth every 4 hours as needed to see if this helps the cough.   Your blood pressure is markedly high.  I recommend you establish with a primary care doctor to help you manage this.

## 2015-04-24 NOTE — ED Provider Notes (Addendum)
CSN: [id number]     Arrival date & time 04/24/15  1347 History   First MD Initiated Contact with Patient 04/24/15 1541     Chief Complaint  Patient presents with  . Cough  . Hand Pain    left 2nd finger   (Consider location/radiation/quality/duration/timing/severity/associated sxs/prior Treatment) Patient is a 60 y.o. female presenting with cough and hand pain. The history is provided by the patient. No language interpreter was used.  Cough Associated symptoms: no chest pain, no chills, no diaphoresis, no fever, no shortness of breath and no wheezing   Hand Pain Pertinent negatives include no chest pain and no shortness of breath.  Patient presents for complaint of cough x 3 weeks, productive of scant clear sputum.  Cough worse at night when she lays down. Has not had associated shortness of breath or wheezing, not feeling ill. Has history of asthma, but this does not feel like her usual asthma exacerbation.  Unable to give precise answer as to when last asthma flare occurred.    She also remarks of L index finger with painless swelling last week, improved markedly since onset.  Had ankle swelling last week which resolved with Epsom salt soaks in one day.  None since. No trauma to the L hand.  Patient with history HTN, previously on lisinopril which she stopped taking in February when ran out.  Does not have a primary doctor.   Social Hx; Never-smoker, no alcohol or drug use.    ROS: Denies fevers/chills, no malaise, no shortness of breath, no chest pain or pressure now or in the past.  Ankle swelling once last week (see above), none since.  No N/V/D, no diaphoresis. No abdominal pain.    Past Medical History  Diagnosis Date  . Hypertension   . Asthma   . High cholesterol    Past Surgical History  Procedure Laterality Date  . Cesarean section     History reviewed. No pertinent family history. Social History  Substance Use Topics  . Smoking status: Never Smoker   . Smokeless  tobacco: None  . Alcohol Use: No   OB History    No data available     Review of Systems  Constitutional: Negative for fever, chills, diaphoresis and fatigue.  Respiratory: Positive for cough. Negative for chest tightness, shortness of breath, wheezing and stridor.   Cardiovascular: Negative for chest pain.  All other systems reviewed and are negative.   Allergies  Review of patient's allergies indicates no known allergies.  Home Medications   Prior to Admission medications   Medication Sig Start Date End Date Taking? Authorizing Provider  albuterol (PROVENTIL HFA;VENTOLIN HFA) 108 (90 BASE) MCG/ACT inhaler Inhale 2 puffs into the lungs every 6 (six) hours as needed for wheezing or shortness of breath. 01/06/15  Yes Linna HoffJames D Kindl, MD  Albuterol Sulfate (PROAIR RESPICLICK) 108 (90 BASE) MCG/ACT AEPB Inhale 2 puffs into the lungs every 4 (four) hours as needed. 01/07/15   Charm RingsErin J Honig, MD  lisinopril-hydrochlorothiazide (PRINZIDE,ZESTORETIC) 10-12.5 MG tablet Take 1 tablet by mouth daily. 01/06/15   Linna HoffJames D Kindl, MD  naproxen (NAPROSYN) 500 MG tablet Take 1 tablet (500 mg total) by mouth 2 (two) times daily. For 1 week, then as needed 07/24/14   Charm RingsErin J Honig, MD   Meds Ordered and Administered this Visit  Medications - No data to display  BP 202/118 mmHg  Pulse 86  Temp(Src) 97.9 F (36.6 C) (Oral)  Resp 20  SpO2 96% No data  found.   Physical Exam  Constitutional: She appears well-developed and well-nourished. No distress.  HENT:  Head: Atraumatic.  Nose: Nose normal.  Mouth/Throat: Oropharynx is clear and moist. No oropharyngeal exudate.  Eyes: Conjunctivae and EOM are normal. Pupils are equal, round, and reactive to light. Right eye exhibits no discharge. Left eye exhibits no discharge.  Neck: Normal range of motion. Neck supple. No JVD present. No tracheal deviation present. No thyromegaly present.  Cardiovascular: Normal rate, regular rhythm and normal heart sounds.    No murmur heard. Pulmonary/Chest: Effort normal. No stridor. No respiratory distress. She has no wheezes. She has rales. She exhibits no tenderness.  Bibasilar rales heard on auscultation; good air movement throughout.  No wheezes audible.   Patient without increased work of breathing.   Abdominal: Soft. There is no tenderness.  Musculoskeletal:  Palpable dp pulses bilaterally.  Sensation intact in both feet; brisk capillary refill < 2 seconds.  L index finger with trace nonpitting edema; no active synovitis.  Full active flexion/extension and handgrip full.  Sensation in distal tip of L index finger intact. Palpable radial pulses bilaterally.   Lymphadenopathy:    She has no cervical adenopathy.  Skin: She is not diaphoretic.    ED Course  Procedures (including critical care time)  Labs Review Labs Reviewed - No data to display  Imaging Review No results found.   Visual Acuity Review  Right Eye Distance:   Left Eye Distance:   Bilateral Distance:    Right Eye Near:   Left Eye Near:    Bilateral Near:         MDM   1. Essential hypertension   2. Bibasilar crackles   3. Cough    Patient with markedly elevated BP, presents with complaint of cough worse when supine x 3 weeks. Exam with bibasilar rales, no wheezes.  No other findings of peripheral edema.   ECG with question LVH but no ischemic changes; CXR clear without evidence of pulmonary edema or consolidtion; iStat labs in [facility] unremarkable.  History of marked HTN on previous visits.  Cough may be attributable to seasonal allergens, trial cetirizine  daily.  Encouraged to establish with primary doctor for HTN management. Paula Compton, MD    Barbaraann Barthel, MD 04/24/15 1605  Barbaraann Barthel, MD 04/24/15 1630

## 2015-04-24 NOTE — ED Notes (Addendum)
Pt has had a cough for three weeks.  She states it started with sinus congestion, but that has since gone. She has a history of asthma with an inhaler at home. She denies any fever.  She also complains of a right 2nd finger that has been swollen for a week.  Pt's BP is elevated today.  She has not had her lisinopril for over 2 weeks.  She was prescribed a 3 month supply here, but has been unable to get Medicaid and get a PCP since then.

## 2015-09-11 ENCOUNTER — Ambulatory Visit (HOSPITAL_COMMUNITY)
Admission: EM | Admit: 2015-09-11 | Discharge: 2015-09-11 | Disposition: A | Discharge status: Home / Self Care | Payer: Medicaid Other | Attending: Family Medicine | Admitting: Family Medicine

## 2015-09-11 ENCOUNTER — Encounter (HOSPITAL_COMMUNITY): Payer: Self-pay | Admitting: Emergency Medicine

## 2015-09-11 DIAGNOSIS — J45909 Unspecified asthma, uncomplicated: Secondary | ICD-10-CM | POA: Diagnosis not present

## 2015-09-11 DIAGNOSIS — I10 Essential (primary) hypertension: Secondary | ICD-10-CM

## 2015-09-11 DIAGNOSIS — Z598 Other problems related to housing and economic circumstances: Secondary | ICD-10-CM | POA: Diagnosis not present

## 2015-09-11 DIAGNOSIS — Z599 Problem related to housing and economic circumstances, unspecified: Secondary | ICD-10-CM

## 2015-09-11 LAB — POCT I-STAT, CHEM 8
BUN: 11 mg/dL (ref 6–20)
CALCIUM ION: 1.25 mmol/L — AB (ref 1.12–1.23)
CHLORIDE: 99 mmol/L — AB (ref 101–111)
CREATININE: 0.7 mg/dL (ref 0.44–1.00)
GLUCOSE: 83 mg/dL (ref 65–99)
HCT: 46 % (ref 36.0–46.0)
Hemoglobin: 15.6 g/dL — ABNORMAL HIGH (ref 12.0–15.0)
Potassium: 4 mmol/L (ref 3.5–5.1)
Sodium: 141 mmol/L (ref 135–145)
TCO2: 30 mmol/L (ref 0–100)

## 2015-09-11 MED ORDER — LISINOPRIL-HYDROCHLOROTHIAZIDE 10-12.5 MG PO TABS: 1.0000 | ORAL_TABLET | Freq: Every day | ORAL | 1 refills | Status: DC

## 2015-09-11 MED ORDER — ALBUTEROL SULFATE HFA 108 (90 BASE) MCG/ACT IN AERS
INHALATION_SPRAY | RESPIRATORY_TRACT | Status: AC
  Filled 2015-09-11: qty 6.7

## 2015-09-11 MED ORDER — ALBUTEROL SULFATE HFA 108 (90 BASE) MCG/ACT IN AERS
2.0000 | INHALATION_SPRAY | Freq: Once | RESPIRATORY_TRACT | Status: AC
  Administered 2015-09-11: 2 via RESPIRATORY_TRACT

## 2015-09-11 NOTE — Discharge Instructions (Signed)
Use the albuterol inhaler as needed for wheezing or trouble breathing.   Take the BP medication every day.   It is VERY IMPORTANT that you establish care with a primary doctor to manage your blood pressure and asthma.

## 2015-09-11 NOTE — ED Notes (Signed)
Handed ekg to dr Jarvis Newcomer

## 2015-09-11 NOTE — ED Provider Notes (Signed)
[facility]  CSN: [id number] Arrival date & time: 09/11/15  1012  First Provider Contact:  First MD Initiated Contact with Patient 09/11/15 1036     History   Chief Complaint Chief Complaint  Patient presents with  . Shortness of Breath   HPI Terri Winters is a 60 y.o. female.   HPI She reports 3 weeks of shortness of breath with exertion since running out of albuterol. She endorses wheezing and dyspnea upon walking out to the mailbox, climbing a flight of stairs, or taking out the trash, the severity or frequency have not changed. She denies cough, fever, chest pain, leg swelling, orthopnea, PND, headache, changes in urination. Symptoms typically worsen this time of year as well.   She has also been out of BP medications for many months. She has no PCP, pending [place] medicaid approval.   Past Medical History:  Diagnosis Date  . Asthma   . High cholesterol   . Hypertension    There are no active problems to display for this patient.  Past Surgical History:  Procedure Laterality Date  . CESAREAN SECTION     OB History    No data available     Home Medications    Prior to Admission medications   Medication Sig Start Date End Date Taking? Authorizing Provider  albuterol (PROVENTIL HFA;VENTOLIN HFA) 108 (90 Base) MCG/ACT inhaler Inhale 2 puffs into the lungs every 6 (six) hours as needed for wheezing or shortness of breath. 04/24/15   Barbaraann Barthel, MD  cetirizine (ZYRTEC) 10 MG tablet Take 1 tablet (10 mg total) by mouth daily. 04/24/15   Barbaraann Barthel, MD  lisinopril-hydrochlorothiazide (PRINZIDE,ZESTORETIC) 10-12.5 MG tablet Take 1 tablet by mouth daily. 09/11/15   Tyrone Nine, MD  naproxen (NAPROSYN) 500 MG tablet Take 1 tablet (500 mg total) by mouth 2 (two) times daily. For 1 week, then as needed 07/24/14   Charm Rings, MD   Family History No family history on file.  Social History Social History  Substance Use Topics  . Smoking status: Never  Smoker  . Smokeless tobacco: Not on file  . Alcohol use No   Allergies   Review of patient's allergies indicates no known allergies.   Review of Systems Review of Systems As above  Physical Exam Triage Vital Signs ED Triage Vitals  Enc Vitals Group     BP 09/11/15 1057 (!) 208/118     Pulse Rate 09/11/15 1057 85     Resp 09/11/15 1057 16     Temp 09/11/15 1057 98.1 F (36.7 C)     Temp Source 09/11/15 1057 Oral     SpO2 09/11/15 1057 96 %     Weight --      Height --      Head Circumference --      Peak Flow --      Pain Score 09/11/15 1058 0     Pain Loc --      Pain Edu? --      Excl. in GC? --    No data found.  Updated Vital Signs BP (!) 208/118   Pulse 85   Temp 98.1 F (36.7 C) (Oral)   Resp 16   SpO2 96%   Physical Exam  Constitutional: She is oriented to person, place, and time. She appears well-developed and well-nourished. No distress.  Eyes: EOM are normal. Pupils are equal, round, and reactive to light. No scleral icterus.  Neck: Neck supple.  No JVD present.  Cardiovascular: Normal rate, regular rhythm, normal heart sounds and intact distal pulses.  Exam reveals no gallop and no friction rub.   No murmur heard. no LE edema  Pulmonary/Chest: Effort normal. No respiratory distress. She has no wheezes. She has no rales. She exhibits no tenderness.  slightly prolonged expiration  Abdominal: Soft. Bowel sounds are normal. She exhibits no distension. There is no tenderness.  Musculoskeletal: Normal range of motion. She exhibits no edema or tenderness.  Lymphadenopathy:    She has no cervical adenopathy.  Neurological: She is alert and oriented to person, place, and time. She exhibits normal muscle tone.  Skin: Skin is warm and dry.  Vitals reviewed.  UC Treatments / Results  Labs (all labs ordered are listed, but only abnormal results are displayed) Labs Reviewed  POCT I-STAT, CHEM 8 - Abnormal; Notable for the following:       Result Value    Chloride 99 (*)    Calcium, Ion 1.25 (*)    Hemoglobin 15.6 (*)    All other components within normal limits    EKG  EKG Interpretation None     ECG reviewed personally by me: Regular rate, NSR, normal axis, QTc  not prolonged, no ST segment changes, no T wave abnormalities. Changes suggestive but not diagnostic of LVH with no conduction delay. Unchanged when compared to old ECG.   Radiology No results found.  Procedures Procedures (including critical care time)  Medications Ordered in UC Medications  albuterol (PROVENTIL HFA;VENTOLIN HFA) 108 (90 Base) MCG/ACT inhaler 2 puff (2 puffs Inhalation Given 09/11/15 1203)   Initial Impression / Assessment and Plan / UC Course  I have reviewed the triage vital signs and the nursing notes.  Pertinent labs & imaging results that were available during my care of the patient were reviewed by me and considered in my medical decision making (see chart for details).  Final Clinical Impressions(s) / UC Diagnoses   Final diagnoses:  Asthma, unspecified asthma severity, uncomplicated  Financial difficulties  Essential hypertension   60 yo with a history of asthma without PCP presenting for weeks-months of exertional dyspnea and wheezing consistent with uncontrolled asthma without access to albuterol. No respiratory distress, hypoxemia, or acute findings consistent with pneumonia, CHF. No previous Dx of ILD or COPD, nonsmoker. No anemia on iStat bloodwork today, which actually shows mild polycythemia. No focal findings indicating CXR, though lungs slightly hyperexpanded in previous film. Not in acute exacerbation currently, so will give albuterol MDI to be used as needed. Urged strongly her need to establish care and probably undergo pulm evaluation with PFTs and/or HRCT.   She has severe range HTN which is not new for her, in setting of running out of medication (lisinopril-HCTZ), without evidence of end-organ damage. ECG without evidence of  ischemia. Will refill BP medication (Cr and K wnl) from $4 list x 3 months, which should be enough time to establish care with PCP.   New Prescriptions Current Discharge Medication List       Tyrone Nine, MD 09/11/15 1224

## 2015-09-11 NOTE — ED Triage Notes (Signed)
PT reports exertional SOB for the past few weeks. PT was given a BP med and an inhaler last time she came here. PT does not know how to use inhaler correctly. PT ran out of BP meds a few months ago

## 2015-12-22 ENCOUNTER — Ambulatory Visit (HOSPITAL_COMMUNITY)
Admission: EM | Admit: 2015-12-22 | Discharge: 2015-12-22 | Disposition: A | Discharge status: Home / Self Care | Payer: Medicaid Other | Attending: Emergency Medicine | Admitting: Emergency Medicine

## 2015-12-22 ENCOUNTER — Encounter (HOSPITAL_COMMUNITY): Payer: Self-pay | Admitting: Emergency Medicine

## 2015-12-22 DIAGNOSIS — M62830 Muscle spasm of back: Secondary | ICD-10-CM

## 2015-12-22 DIAGNOSIS — I10 Essential (primary) hypertension: Secondary | ICD-10-CM | POA: Diagnosis not present

## 2015-12-22 DIAGNOSIS — S29019A Strain of muscle and tendon of unspecified wall of thorax, initial encounter: Secondary | ICD-10-CM | POA: Diagnosis not present

## 2015-12-22 DIAGNOSIS — J4520 Mild intermittent asthma, uncomplicated: Secondary | ICD-10-CM | POA: Diagnosis not present

## 2015-12-22 MED ORDER — DICLOFENAC SODIUM 1 % TD GEL: 1.0000 "application " | Freq: Four times a day (QID) | TRANSDERMAL | 0 refills | Status: AC

## 2015-12-22 MED ORDER — ALBUTEROL SULFATE HFA 108 (90 BASE) MCG/ACT IN AERS: 2.0000 | INHALATION_SPRAY | RESPIRATORY_TRACT | 0 refills | Status: AC | PRN

## 2015-12-22 MED ORDER — LISINOPRIL-HYDROCHLOROTHIAZIDE 10-12.5 MG PO TABS: 1.0000 | ORAL_TABLET | Freq: Every day | ORAL | 0 refills | Status: DC

## 2015-12-22 NOTE — ED Provider Notes (Signed)
CSN: [id number]     Arrival date & time 12/22/15  1002 History   First MD Initiated Contact with Patient 12/22/15 1043     Chief Complaint  Patient presents with  . Back Pain   (Consider location/radiation/quality/duration/timing/severity/associated sxs/prior Treatment) 60 year old female with a history of asthma and hypertension presents to the urgent care with back pain for one week. She states it is located beneath the posterior Bra strap at approximately the T6 and 7 level. She describes it as a burning tingling type pain. It is worse with movement, bending and especially with vacuuming. Denies any known trauma, fall or blunt trauma. Denies distal paresthesias or weakness. She states her asthma seems to be acting up. Occasionally she develops occasional dyspnea thought to be secondary to bronchospasm as well as occasional DOE. As of now the patient has no shortness of breath or respiratory difficulty. She has no PCP and is out of her albuterol. Chest and appointment with a new PCP on the 29th of this month. She is requesting refill on her Zestoretic as well as her albuterol inhaler.       Past Medical History:  Diagnosis Date  . Asthma   . High cholesterol   . Hypertension    Past Surgical History:  Procedure Laterality Date  . CESAREAN SECTION     No family history on file. Social History  Substance Use Topics  . Smoking status: Never Smoker  . Smokeless tobacco: Not on file  . Alcohol use No   OB History    No data available     Review of Systems  Constitutional: Positive for activity change. Negative for diaphoresis and fever.  HENT: Positive for postnasal drip and rhinorrhea. Negative for congestion, sore throat and trouble swallowing.   Eyes: Negative.   Respiratory: Positive for cough, shortness of breath and wheezing.   Cardiovascular: Negative for chest pain.  Gastrointestinal: Negative.   Genitourinary: Negative.   Musculoskeletal: Positive for back pain.  Negative for neck pain and neck stiffness.  Skin: Negative.   Neurological: Negative.   All other systems reviewed and are negative.   Allergies  Patient has no known allergies.  Home Medications   Prior to Admission medications   Medication Sig Start Date End Date Taking? Authorizing Provider  albuterol (PROVENTIL HFA;VENTOLIN HFA) 108 (90 Base) MCG/ACT inhaler Inhale 2 puffs into the lungs every 4 (four) hours as needed for wheezing or shortness of breath. 12/22/15   Hayden Rasmussenavid Dudley Mages, NP  cetirizine (ZYRTEC) 10 MG tablet Take 1 tablet (10 mg total) by mouth daily. 04/24/15   Barbaraann BarthelJames O Breen, MD  lisinopril-hydrochlorothiazide (ZESTORETIC) 10-12.5 MG tablet Take 1 tablet by mouth daily. 12/22/15   Hayden Rasmussenavid Darshawn Boateng, NP  naproxen (NAPROSYN) 500 MG tablet Take 1 tablet (500 mg total) by mouth 2 (two) times daily. For 1 week, then as needed 07/24/14   Charm RingsErin J Honig, MD   Meds Ordered and Administered this Visit  Medications - No data to display  BP 156/84 (BP Location: Right Arm)   Pulse 97   Temp 97.8 F (36.6 C) (Oral)   Resp 16   SpO2 98%  No data found.   Physical Exam  Constitutional: She is oriented to person, place, and time. She appears well-developed and well-nourished. No distress.  HENT:  Head: Normocephalic and atraumatic.  Right Ear: External ear normal.  Left Ear: External ear normal.  Eyes: EOM are normal. Pupils are equal, round, and reactive to light.  Neck: Normal range of  motion. Neck supple.  Cardiovascular: Normal rate, regular rhythm, normal heart sounds and intact distal pulses.   Pulmonary/Chest: Effort normal and breath sounds normal. No respiratory distress.  No adventitious sounds of the lungs. Good air movement. Inspiratory phase equals expiratory phase. No coughing during exam.  Musculoskeletal: Normal range of motion. She exhibits no edema.  Point tenderness to the small area of the right parathoracic musculature at approximately level T6. There is a small  projection of soft tissue which is tender and likely representing a muscle spasm. Palpation reproduces the pain for which she has been having all week and presents. There is no spinal tenderness to percussion or palpation. No step-off deformities. No discoloration. Forward flexion and extension of the spine is normal and without pain.  Lymphadenopathy:    She has no cervical adenopathy.  Neurological: She is alert and oriented to person, place, and time.  Skin: Skin is warm and dry. Capillary refill takes less than 2 seconds.  Psychiatric: She has a normal mood and affect.  Nursing note and vitals reviewed.   Urgent Care Course   Clinical Course     Procedures (including critical care time)  Labs Review Labs Reviewed - No data to display  Imaging Review No results found.   Visual Acuity Review  Right Eye Distance:   Left Eye Distance:   Bilateral Distance:    Right Eye Near:   Left Eye Near:    Bilateral Near:         MDM   1. Muscle spasm of back   2. Acute thoracic myofascial strain, initial encounter   3. Essential hypertension   4. Mild intermittent asthma, unspecified whether complicated    There is a risk of taking medications prescribed by a health care provider without obtaining a complete history, labwork or proper physical exam, etc. This cannot be completed adequately at an urgent care. There can be multiple problems, some serious,  associated with medications and undetermined conditions of your health status. This action is performed as a last resort in order to supply you with medication. By receiving these prescriptions you are ackowleging and accepting these risks and will not hold the prescriber or any agent of [facility] as responsible for any adverse outcomes.  Meds ordered this encounter  Medications  . albuterol (PROVENTIL HFA;VENTOLIN HFA) 108 (90 Base) MCG/ACT inhaler    Sig: Inhale 2 puffs into the lungs every 4  (four) hours as needed for wheezing or shortness of breath.    Dispense:  1 Inhaler    Refill:  0    Order Specific Question:   Supervising Provider    Answer:   Domenick GongMORTENSON, ASHLEY [[id number]]  . lisinopril-hydrochlorothiazide (ZESTORETIC) 10-12.5 MG tablet    Sig: Take 1 tablet by mouth daily.    Dispense:  30 tablet    Refill:  0    Order Specific Question:   Supervising Provider    Answer:   Domenick GongMORTENSON, ASHLEY [[id number]]  . diclofenac sodium (VOLTAREN) 1 % GEL    Sig: Apply 1 application topically 4 (four) times daily.    Dispense:  100 g    Refill:  0    Order Specific Question:   Supervising Provider    Answer:   Domenick GongMORTENSON, ASHLEY [[id number]]        Hayden Rasmussenavid Kaelie Henigan, NP 12/22/15 1114

## 2015-12-22 NOTE — ED Triage Notes (Signed)
Reports mid-back pain, no known injury.

## 2015-12-22 NOTE — Discharge Instructions (Signed)
Apply heat to the area of the back with pain. Perform stretches as demonstrated. May apply the diclofenac gel 4 times a day to help with back pain. Limit work such as repetitive work associated with vacuuming, lifting and other activities to help the back to heal. Be sure to keep your appointment with your doctor on the 29th. You have been given a refill on your blood pressure medicine until you see your doctor. There is a risk of taking medications prescribed by a health care provider without obtaining a complete history, labwork or proper physical exam, etc. This cannot be completed adequately at an urgent care. There can be multiple problems, some serious,  associated with medications and undetermined conditions of your health status. This action is performed as a last resort in order to supply you with medication. By receiving these prescriptions you are ackowleging and accepting these risks and will not hold the prescriber or any agent of [facility] as responsible for any adverse outcomes.

## 2016-02-28 ENCOUNTER — Ambulatory Visit (HOSPITAL_COMMUNITY)
Admission: EM | Admit: 2016-02-28 | Discharge: 2016-02-28 | Disposition: A | Discharge status: Home / Self Care | Payer: PRIVATE HEALTH INSURANCE | Attending: Family Medicine | Admitting: Family Medicine

## 2016-02-28 ENCOUNTER — Encounter (HOSPITAL_COMMUNITY): Payer: Self-pay | Admitting: Emergency Medicine

## 2016-02-28 DIAGNOSIS — M542 Cervicalgia: Secondary | ICD-10-CM | POA: Diagnosis not present

## 2016-02-28 MED ORDER — IBUPROFEN 800 MG PO TABS: 800.0000 mg | ORAL_TABLET | Freq: Three times a day (TID) | ORAL | 0 refills | Status: AC

## 2016-02-28 MED ORDER — CYCLOBENZAPRINE HCL 10 MG PO TABS: 10.0000 mg | ORAL_TABLET | Freq: Three times a day (TID) | ORAL | 0 refills | Status: AC | PRN
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
## 2016-03-02 ENCOUNTER — Ambulatory Visit (HOSPITAL_COMMUNITY)
Admission: EM | Admit: 2016-03-02 | Discharge: 2016-03-02 | Disposition: A | Discharge status: Home / Self Care | Payer: PRIVATE HEALTH INSURANCE | Attending: Family Medicine | Admitting: Family Medicine

## 2016-03-02 ENCOUNTER — Encounter (HOSPITAL_COMMUNITY): Payer: Self-pay | Admitting: *Deleted

## 2016-03-02 DIAGNOSIS — S161XXA Strain of muscle, fascia and tendon at neck level, initial encounter: Secondary | ICD-10-CM

## 2016-03-02 MED ORDER — KETOROLAC TROMETHAMINE 30 MG/ML IJ SOLN
30.0000 mg | Freq: Once | INTRAMUSCULAR | Status: AC
  Administered 2016-03-02: 30 mg via INTRAMUSCULAR

## 2016-03-02 MED ORDER — KETOROLAC TROMETHAMINE 30 MG/ML IJ SOLN
INTRAMUSCULAR | Status: AC
  Filled 2016-03-02: qty 1

## 2016-03-02 NOTE — Discharge Instructions (Signed)
Wear collar, use your medicine and use heat, see your doctor as needed
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
## 2016-03-03 ENCOUNTER — Ambulatory Visit
Admission: RE | Admit: 2016-03-03 | Discharge: 2016-03-03 | Disposition: A | Discharge status: Home / Self Care | Payer: Medicaid Other | Source: Ambulatory Visit | Attending: Nurse Practitioner | Admitting: Nurse Practitioner

## 2016-03-03 ENCOUNTER — Other Ambulatory Visit: Payer: Self-pay | Admitting: Nurse Practitioner

## 2016-03-03 DIAGNOSIS — M542 Cervicalgia: Secondary | ICD-10-CM

## 2016-09-10 ENCOUNTER — Ambulatory Visit (HOSPITAL_COMMUNITY)
Admission: EM | Admit: 2016-09-10 | Discharge: 2016-09-10 | Disposition: A | Discharge status: Home / Self Care | Payer: Medicaid Other | Attending: Emergency Medicine | Admitting: Emergency Medicine

## 2016-09-10 ENCOUNTER — Encounter (HOSPITAL_COMMUNITY): Payer: Self-pay

## 2016-09-10 DIAGNOSIS — S39012A Strain of muscle, fascia and tendon of lower back, initial encounter: Secondary | ICD-10-CM

## 2016-09-10 DIAGNOSIS — M62830 Muscle spasm of back: Secondary | ICD-10-CM

## 2016-09-10 DIAGNOSIS — T148XXA Other injury of unspecified body region, initial encounter: Secondary | ICD-10-CM | POA: Diagnosis not present

## 2016-09-10 MED ORDER — CYCLOBENZAPRINE HCL 10 MG PO TABS: 10.0000 mg | ORAL_TABLET | Freq: Two times a day (BID) | ORAL | 0 refills | Status: DC | PRN

## 2016-09-10 MED ORDER — KETOROLAC TROMETHAMINE 30 MG/ML IJ SOLN
30.0000 mg | Freq: Once | INTRAMUSCULAR | Status: AC
  Administered 2016-09-10: 30 mg via INTRAMUSCULAR

## 2016-09-10 MED ORDER — KETOROLAC TROMETHAMINE 30 MG/ML IJ SOLN
INTRAMUSCULAR | Status: AC
  Filled 2016-09-10: qty 1

## 2016-09-10 NOTE — ED Triage Notes (Signed)
[facility], pt denies any injury nut states it feels like there are rods in her pain a very painful soreness, pt has taken ibuprofen to treat pain this morning as well as a hot compress

## 2016-09-10 NOTE — Discharge Instructions (Signed)
We discussed that the area is more muscular than bone  Take the muscle relaxer for a few days and if not better in 1 week follow up with your primary care doctor.  Do not drive while taking this medication. You can still take motrin or antiinflammatory's as needed.

## 2016-09-10 NOTE — ED Provider Notes (Signed)
CSN: [id number]     Arrival date & time 09/10/16  1807 History   First MD Initiated Contact with Patient 09/10/16 1847     Chief Complaint  Patient presents with  . Back Pain   (Consider location/radiation/quality/duration/timing/severity/associated sxs/prior Treatment) Pt states that she has LT lower back pain for a few days. States that it is not midline, does not radiate. No loss of bowels or urine. Thinks that she might have got up wrong. Denies any cv tenderness no urinary sx.       Past Medical History:  Diagnosis Date  . Asthma   . High cholesterol   . Hypertension    Past Surgical History:  Procedure Laterality Date  . CESAREAN SECTION     History reviewed. No pertinent family history. Social History  Substance Use Topics  . Smoking status: Never Smoker  . Smokeless tobacco: Never Used  . Alcohol use No   OB History    No data available     Review of Systems  Constitutional: Negative.   Respiratory: Negative.   Cardiovascular: Negative.   Gastrointestinal: Negative.   Musculoskeletal: Positive for back pain.  Skin: Negative.   Neurological: Negative.     Allergies  Patient has no known allergies.  Home Medications   Prior to Admission medications   Medication Sig Start Date End Date Taking? Authorizing Provider  albuterol (PROVENTIL HFA;VENTOLIN HFA) 108 (90 Base) MCG/ACT inhaler Inhale 2 puffs into the lungs every 4 (four) hours as needed for wheezing or shortness of breath. 12/22/15  Yes Mabe, Onalee Huaavid, NP  cetirizine (ZYRTEC) 10 MG tablet Take 1 tablet (10 mg total) by mouth daily. 04/24/15  Yes Barbaraann BarthelBreen, James O, MD  diclofenac sodium (VOLTAREN) 1 % GEL Apply 1 application topically 4 (four) times daily. 12/22/15  Yes Mabe, Onalee Huaavid, NP  lisinopril-hydrochlorothiazide (ZESTORETIC) 10-12.5 MG tablet Take 1 tablet by mouth daily. 12/22/15  Yes Mabe, Onalee Huaavid, NP  naproxen (NAPROSYN) 500 MG tablet Take 1 tablet (500 mg total) by mouth 2 (two) times daily. For 1  week, then as needed 07/24/14  Yes Charm RingsHonig, Erin J, MD  cyclobenzaprine (FLEXERIL) 10 MG tablet Take 1 tablet (10 mg total) by mouth 2 (two) times daily as needed for muscle spasms. 09/10/16   Tobi BastosMitchell, Jarry Manon A, NP   Meds Ordered and Administered this Visit   Medications  ketorolac (TORADOL) 30 MG/ML injection 30 mg (not administered)    BP (!) 202/115 (BP Location: Left Arm)   Pulse 100   Temp 98.5 F (36.9 C) (Oral)   Resp 17   SpO2 99%  No data found.   Physical Exam  Constitutional: She appears well-developed.  Cardiovascular: Normal rate and regular rhythm.   Pulmonary/Chest: Effort normal and breath sounds normal.  Abdominal: Soft. Bowel sounds are normal.  Musculoskeletal: She exhibits tenderness.  LT lower back near flank pain, only with movement no midline pain. No sciatic pain non radiating. Full ROM   Neurological: She is alert.  Skin: Skin is warm. Capillary refill takes less than 2 seconds.    Urgent Care Course     Procedures (including critical care time)  Labs Review Labs Reviewed - No data to display  Imaging Review No results found.           MDM   1. Muscle strain    We discussed that the area is more muscular than bone  Take the muscle relaxer for a few days and if not better in 1 week follow up  with your primary care doctor.  Do not drive while taking this medication. You can still take motrin or antiinflammatory's as needed.    Tobi BastosMitchell, Shekia Kuper A, NP 09/10/16 1900

## 2017-01-06 ENCOUNTER — Emergency Department (HOSPITAL_COMMUNITY)
Admission: EM | Admit: 2017-01-06 | Discharge: 2017-01-06 | Disposition: A | Discharge status: Home / Self Care | Payer: Medicaid Other | Attending: Emergency Medicine | Admitting: Emergency Medicine

## 2017-01-06 ENCOUNTER — Encounter (HOSPITAL_COMMUNITY): Payer: Self-pay

## 2017-01-06 DIAGNOSIS — Z79899 Other long term (current) drug therapy: Secondary | ICD-10-CM | POA: Diagnosis not present

## 2017-01-06 DIAGNOSIS — R55 Syncope and collapse: Secondary | ICD-10-CM | POA: Diagnosis present

## 2017-01-06 DIAGNOSIS — I10 Essential (primary) hypertension: Secondary | ICD-10-CM | POA: Diagnosis not present

## 2017-01-06 DIAGNOSIS — R42 Dizziness and giddiness: Secondary | ICD-10-CM | POA: Insufficient documentation

## 2017-01-06 DIAGNOSIS — J45909 Unspecified asthma, uncomplicated: Secondary | ICD-10-CM | POA: Diagnosis not present

## 2017-01-06 LAB — BASIC METABOLIC PANEL
Anion gap: 5 (ref 5–15)
BUN: 11 mg/dL (ref 6–20)
CO2: 27 mmol/L (ref 22–32)
CREATININE: 0.58 mg/dL (ref 0.44–1.00)
Calcium: 9 mg/dL (ref 8.9–10.3)
Chloride: 105 mmol/L (ref 101–111)
GFR calc Af Amer: >60 mL/min (ref >60)
Glucose, Bld: 108 mg/dL — ABNORMAL HIGH (ref 65–99)
POTASSIUM: 3.6 mmol/L (ref 3.5–5.1)
SODIUM: 137 mmol/L (ref 135–145)

## 2017-01-06 LAB — CBC WITH DIFFERENTIAL/PLATELET
Basophils Absolute: 0 10*3/uL (ref 0.0–0.1)
Basophils Relative: 0 %
EOS ABS: 0.1 10*3/uL (ref 0.0–0.7)
EOS PCT: 1 %
HCT: 35.2 % — ABNORMAL LOW (ref 36.0–46.0)
Hemoglobin: 11.7 g/dL — ABNORMAL LOW (ref 12.0–15.0)
LYMPHS ABS: 0.9 10*3/uL (ref 0.7–4.0)
Lymphocytes Relative: 20 %
MCH: 29.8 pg (ref 26.0–34.0)
MCHC: 33.2 g/dL (ref 30.0–36.0)
MCV: 89.6 fL (ref 78.0–100.0)
Monocytes Absolute: 0.2 10*3/uL (ref 0.1–1.0)
Monocytes Relative: 5 %
Neutro Abs: 3.2 10*3/uL (ref 1.7–7.7)
Neutrophils Relative %: 73 %
PLATELETS: 220 10*3/uL (ref 150–400)
RBC: 3.93 MIL/uL (ref 3.87–5.11)
RDW: 13.9 % (ref 11.5–15.5)
WBC: 4.4 10*3/uL (ref 4.0–10.5)

## 2017-01-06 LAB — CBG MONITORING, ED: GLUCOSE-CAPILLARY: 105 mg/dL — AB (ref 65–99)

## 2017-01-06 LAB — I-STAT TROPONIN, ED: TROPONIN I, POC: 0 ng/mL (ref 0.00–0.08)

## 2017-01-06 MED ORDER — SODIUM CHLORIDE 0.9 % IV BOLUS (SEPSIS)
1000.0000 mL | Freq: Once | INTRAVENOUS | Status: AC
  Administered 2017-01-06: 1000 mL via INTRAVENOUS

## 2017-01-06 NOTE — ED Provider Notes (Signed)
[facility] EMERGENCY DEPARTMENT Provider Note  CSN: [id number] Arrival date & time: 01/06/17 1409  Chief Complaint(s) Loss of Consciousness  HPI Terri Winters is a 61 y.o. female    The history is provided by the patient.   Loss of Consciousness    This is a new problem. The current episode started 1 to 2 hours ago. Episode frequency: once.  The problem has been resolved. She lost consciousness for a period of 1 to 5 minutes. The problem is associated with normal activity. Associated symptoms include light-headedness. Pertinent negatives include bladder incontinence, bowel incontinence, chest pain, clumsiness, confusion, congestion, diaphoresis, dizziness, fever, focal sensory loss, focal weakness, headaches, malaise/fatigue, nausea, palpitations, vertigo, visual change, vomiting and weakness. She has tried nothing for the symptoms. Her past medical history is significant for HTN.   Patient was actually seen by her primary care provider's office today and noted to have elevated blood pressures due to not having her blood pressure medicine or any refills.  She went to her PCP in order to obtain a refill for her blood pressure medicine.  At the PCPs office she was given 3 pills to lower her blood pressure as it was in the 190s/110s.  After several minutes, the patient noted that her  systolic blood pressures were in the 150s.  Patient reported that 20-30 minutes after leaving the office, she started feeling lightheaded.  She denied any associated palpitations, chest pain, shortness of breath, nausea.  She denies any recent fevers or infections, vomiting or diarrhea.  Patient reported that she had 2 bananas after her PCPs visit, but did not feel like eating anything due to her lightheadedness.  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  There is she continued to be lightheaded.  She reports going to the  bathroom to have a bowel movement which improved her lightheadedness however when she went back to the patient's bedside several minutes later the lightheadedness returned quickly.  There, the patient's client and her partner noted that the patient was slumping while standing and starting to fall.  Her coworker was able to catch her and eased her down to the floor.  She lost consciousness for approximately 5 minutes.  They deny any seizure activity, apnea, bladder or bowel incontinence.  Patient came to within 5 minutes.  She was at her baseline mental status.  Reported that her lightheadedness had improved.  I spoke to nurse practitioner from the PCPs office who reported that she give the patient labetalol and clonidine X2 while at the office.  She noted the patient's blood pressure was 210/114.  Recheck noted improvement with blood pressures 170/100.  Past Medical History Past Medical History:  Diagnosis Date  . Asthma   . High cholesterol   . Hypertension    There are no active problems to display for this patient.  Home Medication(s) Prior to Admission medications   Medication Sig Start Date End Date Taking? Authorizing Provider  albuterol (PROVENTIL HFA;VENTOLIN HFA) 108 (90 Base) MCG/ACT inhaler Inhale 2 puffs into the lungs every 4 (four) hours as needed for wheezing or shortness of breath. 12/22/15   Hayden Rasmussen, NP  cetirizine (ZYRTEC) 10 MG tablet Take 1 tablet (10 mg total) by mouth daily. 04/24/15   Barbaraann Barthel, MD  cyclobenzaprine (FLEXERIL) 10 MG tablet Take 1 tablet (10 mg total) by mouth 2 (two) times daily as needed for muscle spasms. 09/10/16  Coralyn MarkMitchell, Melanie L, NP  diclofenac sodium (VOLTAREN) 1 % GEL Apply 1 application topically 4 (four) times daily. 12/22/15   Hayden RasmussenMabe, David, NP  lisinopril-hydrochlorothiazide (ZESTORETIC) 10-12.5 MG tablet Take 1 tablet by mouth daily. 12/22/15   Hayden RasmussenMabe, David, NP  naproxen (NAPROSYN) 500 MG tablet Take 1 tablet (500 mg total) by mouth 2  (two) times daily. For 1 week, then as needed 07/24/14   Charm RingsHonig, Erin J, MD                                                                                                                                    Past Surgical History Past Surgical History:  Procedure Laterality Date  . CESAREAN SECTION     Family History History reviewed. No pertinent family history.  Social History Social History   Tobacco Use  . Smoking status: Never Smoker  . Smokeless tobacco: Never Used  Substance Use Topics  . Alcohol use: No  . Drug use: No   Allergies Patient has no known allergies.  Review of Systems Review of Systems  Constitutional: Negative for diaphoresis, fever and malaise/fatigue.  HENT: Negative for congestion.   Cardiovascular: Positive for syncope. Negative for chest pain and palpitations.  Gastrointestinal: Negative for bowel incontinence, nausea and vomiting.  Genitourinary: Negative for bladder incontinence.  Neurological: Positive for light-headedness. Negative for dizziness, vertigo, focal weakness, weakness and headaches.  Psychiatric/Behavioral: Negative for confusion.   All other systems are reviewed and are negative for acute change except as noted in the HPI  Physical Exam Vital Signs  I have reviewed the triage vital signs BP (!) 142/75   Pulse 64   Temp 97.9 F (36.6 C) (Oral)   Resp 16   Ht 5' (1.524 m)   Wt 53.5 kg (118 lb)   SpO2 100%   BMI 23.05 kg/m   Physical Exam  Constitutional: She is oriented to person, place, and time. She appears well-developed and well-nourished. No distress.  HENT:  Head: Normocephalic and atraumatic.  Nose: Nose normal.  Eyes: Conjunctivae and EOM are normal. Pupils are equal, round, and reactive to light. Right eye exhibits no discharge. Left eye exhibits no discharge. No scleral icterus.  Neck: Normal range of motion. Neck supple.  Cardiovascular: Normal rate and regular rhythm. Exam reveals no gallop and no friction rub.   No murmur heard. Pulmonary/Chest: Effort normal and breath sounds normal. No stridor. No respiratory distress. She has no rales.  Abdominal: Soft. She exhibits no distension. There is no tenderness.  Musculoskeletal: She exhibits no edema or tenderness.  Neurological: She is alert and oriented to person, place, and time.  Skin: Skin is warm and dry. No rash noted. She is not diaphoretic. No erythema.  Psychiatric: She has a normal mood and affect.  Vitals reviewed.   ED Results and Treatments Labs (all labs ordered are listed, but only abnormal results are displayed) Labs Reviewed  CBC WITH  DIFFERENTIAL/PLATELET - Abnormal; Notable for the following components:      Result Value   Hemoglobin 11.7 (*)    HCT 35.2 (*)    All other components within normal limits  BASIC METABOLIC PANEL - Abnormal; Notable for the following components:   Glucose, Bld 108 (*)    All other components within normal limits  CBG MONITORING, ED - Abnormal; Notable for the following components:   Glucose-Capillary 105 (*)    All other components within normal limits  I-STAT TROPONIN, ED                                                                                                                         EKG  EKG Interpretation  Date/Time:  Thursday January 06 2017 14:58:35 EST Ventricular Rate:  65 PR Interval:    QRS Duration: 81 QT Interval:  438 QTC Calculation: 456 R Axis:   64 Text Interpretation:  Sinus rhythm Consider right atrial enlargement Borderline T wave abnormalities No significant change since last tracing Confirmed by Drema Pryardama, Casimir Barcellos [phone]([id number]) on 01/06/2017 3:08:11 PM      Radiology No results found. Pertinent labs & imaging results that were available during my care of the patient were reviewed by me and considered in my medical decision making (see chart for details).  Medications Ordered in ED Medications  sodium chloride 0.9 % bolus 1,000 mL (0 mLs Intravenous Stopped 01/06/17  1555)                                                                                                                                    Procedures Procedures  (including critical care time)  Medical Decision Making / ED Course I have reviewed the nursing notes for this encounter and the patient's prior records (if available in EHR or on provided paperwork).    Syncopal episode in the setting of administration of blood pressure medicine and lack of nutrition today. I believe this was likely secondary to hypotension due to labetalol/clonidine versus hypoglycemia from lack of oral nutrition today.  EKG without acute ischemic changes, dysrhythmias, blocks, Brugada, epsilon waves, evidence of HOCM. Trop negative. Low suspicion for cardiac etiology.    CBG within normal limits.  Screening labs grossly reassuring without significant anemia, electrolyte derangement, renal insufficiency.  Orthostatics within normal limits.  Patient's blood pressure now in the 120s.  Still remains asymptomatic.  She was given  oral hydration and nutrition.  Ambulated without complication.  The patient appears reasonably screened and/or stabilized for discharge and I doubt any other medical condition or other [facility] requiring further screening, evaluation, or treatment in the ED at this time prior to discharge.  The patient is safe for discharge with strict return precautions.    Final Clinical Impression(s) / ED Diagnoses Final diagnoses:  Syncope and collapse   Disposition: Discharge  Condition: Good  I have discussed the results, Dx and Tx plan with the patient who expressed understanding and agree(s) with the plan. Discharge instructions discussed at great length. The patient was given strict return precautions who verbalized understanding of the instructions. No further questions at time of discharge.    ED Discharge Orders    None       Follow Up: Primary care provider  Schedule an appointment  as soon as possible for a visit  As needed      This chart was dictated using voice recognition software.  Despite best efforts to proofread,  errors can occur which can change the documentation meaning.  Nira Conn, MD 01/06/17 [phone]

## 2017-01-06 NOTE — ED Triage Notes (Addendum)
Pt arrived via [facility] c/o witnessed syncopal episode lasting just a few seconds.  Pt recently was seen at PCP office where she was given 3 pills but it unsure of what kind they were.  Pt reports being dizzy ever since

## 2017-01-06 NOTE — ED Notes (Signed)
Pt stable, ambulatory, states understanding of discharge instructions 

## 2017-01-06 NOTE — ED Notes (Signed)
Phlebotomy at bedside.

## 2017-03-02 ENCOUNTER — Ambulatory Visit (INDEPENDENT_AMBULATORY_CARE_PROVIDER_SITE_OTHER): Payer: Medicaid Other

## 2017-03-02 ENCOUNTER — Encounter (HOSPITAL_COMMUNITY): Payer: Self-pay | Admitting: Emergency Medicine

## 2017-03-02 ENCOUNTER — Ambulatory Visit (HOSPITAL_COMMUNITY)
Admission: EM | Admit: 2017-03-02 | Discharge: 2017-03-02 | Disposition: A | Discharge status: Home / Self Care | Payer: Medicaid Other | Attending: Family Medicine | Admitting: Family Medicine

## 2017-03-02 DIAGNOSIS — M79671 Pain in right foot: Secondary | ICD-10-CM

## 2017-03-02 DIAGNOSIS — M79672 Pain in left foot: Secondary | ICD-10-CM | POA: Diagnosis not present

## 2017-03-02 MED ORDER — DICLOFENAC SODIUM 75 MG PO TBEC: 75.0000 mg | DELAYED_RELEASE_TABLET | Freq: Two times a day (BID) | ORAL | 0 refills | Status: DC

## 2017-03-02 NOTE — Discharge Instructions (Signed)
If your symptoms continue to persist an MRI of your foot may be needed.

## 2017-03-02 NOTE — ED Provider Notes (Signed)
[facility]   [id number] 03/02/17 Arrival Time: 1022  ASSESSMENT & PLAN:  1. Foot pain, left    Imaging: Dg Foot Complete Right  Result Date: 03/02/2017 CLINICAL DATA:  Foot pain for the past 2 weeks which spontaneously began when the patient got up from a table. The patient is unable to bear full weight on the foot. The pain is medial and there is swelling and erythema anterior to the medial malleolus. No history of gout or previous foot injury. EXAM: RIGHT FOOT COMPLETE - 3+ VIEW COMPARISON:  None in PACs FINDINGS: The bones are subjectively adequately mineralized. The phalanges and metatarsals are intact. The interphalangeal, MTP, and TMT joint spaces are well maintained. There is mild degenerative change of the articulation of the medial border of the tarsal navicular with the first cuneiform. No changes to suggest avascular necrosis are observed. The talus and calcaneus appear intact as well. There is mild soft tissue swelling dorsally and medially over the tarsals. IMPRESSION: No acute fracture nor dislocation is observed. There are degenerative changes centered within the medial tarsal bones. Given the persistent symptoms, MRI would be a useful next imaging step. Electronically Signed   By: David  SwazilandJordan M.D.   On: 03/02/2017 11:32   Recommend OTC analgesics. Will f/u with PCP if not improving over the next week.  Reviewed expectations re: course of current medical issues. Questions answered. Outlined signs and symptoms indicating need for more acute intervention. Patient verbalized understanding. After Visit Summary given.  SUBJECTIVE: History from: patient. Terri Winters is a 62 y.o. female who reports:  Pain/Discomfort of: left lateral foot pain Onset gradual, 2 weeks ago, approximately Frequency: persistent Injury/trama: no Describes as: localized aching without radiation over left lateral foot Severity: mild Progression: stable Relieved by: not  wearing a shoe and not bearing weight Worsened by: weight bearing "makes it sore" Associated symptoms: mild swelling Extremity sensation changes or weakness: none Self treatment: has not tried OTCs for relief of pain  History of similar: no  ROS: As per HPI.   OBJECTIVE:  Vitals:   03/02/17 1052  BP: (!) 173/87  Pulse: (!) 102  Resp: 16  Temp: 98.2 F (36.8 C)  TempSrc: Oral  SpO2: 100%    General appearance: alert; no distress Extremities: no cyanosis or edema; symmetrical with no gross deformities; poorly localized tenderness over her left lateral foot near 5th metatarsal with mild swelling and no bruising; ROM: normal at ankle CV: normal extremity capillary refill Skin: warm and dry Neurologic: normal gait; normal symmetric reflexes in all extremities; normal sensation in all extremities Psychological: alert and cooperative; normal mood and affect  No Known Allergies  Past Medical History:  Diagnosis Date  . Asthma   . High cholesterol   . Hypertension    Social History   Socioeconomic History  . Marital status: Divorced    Spouse name: Not on file  . Number of children: Not on file  . Years of education: Not on file  . Highest education level: Not on file  Social Needs  . Financial resource strain: Not on file  . Food insecurity - worry: Not on file  . Food insecurity - inability: Not on file  . Transportation needs - medical: Not on file  . Transportation needs - non-medical: Not on file  Occupational History  . Not on file  Tobacco Use  . Smoking status: Never Smoker  . Smokeless tobacco: Never Used  Substance and Sexual Activity  . Alcohol  use: No  . Drug use: No  . Sexual activity: Not on file  Other Topics Concern  . Not on file  Social History Narrative  . Not on file   Family History  Family history unknown: Yes   Past Surgical History:  Procedure Laterality Date  . CESAREAN SECTION        Mardella Layman, MD 03/03/17 [phone]

## 2017-03-02 NOTE — ED Triage Notes (Signed)
PT C/O: constant right foot pain onset 2 weeks associated w/swelling  DENIES: inj/trauma  TAKING MEDS: none   A&O x4... NAD... Ambulatory

## 2017-03-07 ENCOUNTER — Encounter: Payer: Self-pay | Admitting: Podiatry

## 2017-03-07 ENCOUNTER — Ambulatory Visit: Payer: Medicaid Other | Admitting: Podiatry

## 2017-03-07 VITALS — BP 185/116 | HR 88

## 2017-03-07 DIAGNOSIS — M76821 Posterior tibial tendinitis, right leg: Secondary | ICD-10-CM | POA: Diagnosis not present

## 2017-03-07 NOTE — Progress Notes (Signed)
   Subjective:    Patient ID: Terri Winters, female    DOB: 12/24/1955, 62 y.o.   MRN: [id number]  HPI    Review of Systems  All other systems reviewed and are negative.      Objective:   Physical Exam        Assessment & Plan:

## 2017-03-10 NOTE — Progress Notes (Signed)
   HPI: 62 year old female presenting today as a new patient with a chief complaint of pain to the lateral right foot that began two weeks ago. She reports associated swelling to the area. Walking and applying pressure increases the pain. She went to the ED three days ago and was given a prescription for Voltaren. She also reports having X-Rays done. Patient is here for further evaluation and treatment.   Past Medical History:  Diagnosis Date  . Asthma   . High cholesterol   . Hypertension      Physical Exam: General: The patient is alert and oriented x3 in no acute distress.  Dermatology: Skin is warm, dry and supple bilateral lower extremities. Negative for open lesions or macerations.  Vascular: Palpable pedal pulses bilaterally. No edema or erythema noted. Capillary refill within normal limits.  Neurological: Epicritic and protective threshold grossly intact bilaterally.   Musculoskeletal Exam: Pain with palpation of the lateral right foot. Range of motion within normal limits to all pedal and ankle joints bilateral. Muscle strength 5/5 in all groups bilateral.    Assessment: - PT tendinitis right   Plan of Care:  - Patient evaluated. X-Rays in Epic reviewed.  - Injection of 0.5 mLs Celestone Soluspan injected into the peroneal tendon sheath of the right foot.  - Continue taking Diclofenac as prescribed from ED. - Recommended good shoe gear. - Return to clinic as needed.    Felecia ShellingBrent M. Remi Lopata, DPM [facility]  Dr. Felecia ShellingBrent M. Cecil Vandyke, DPM    [address]                                        [place], [place] [id number]                Office [phone]  Fax [phone]

## 2017-07-23 ENCOUNTER — Other Ambulatory Visit: Payer: Self-pay

## 2017-07-23 ENCOUNTER — Ambulatory Visit (HOSPITAL_COMMUNITY)
Admission: EM | Admit: 2017-07-23 | Discharge: 2017-07-23 | Disposition: A | Discharge status: Home / Self Care | Payer: Medicaid Other | Attending: Family Medicine | Admitting: Family Medicine

## 2017-07-23 ENCOUNTER — Encounter (HOSPITAL_COMMUNITY): Payer: Self-pay | Admitting: *Deleted

## 2017-07-23 DIAGNOSIS — M436 Torticollis: Secondary | ICD-10-CM | POA: Diagnosis not present

## 2017-07-23 MED ORDER — KETOROLAC TROMETHAMINE 60 MG/2ML IM SOLN
INTRAMUSCULAR | Status: AC
  Filled 2017-07-23: qty 2

## 2017-07-23 MED ORDER — CYCLOBENZAPRINE HCL 10 MG PO TABS: 10.0000 mg | ORAL_TABLET | Freq: Two times a day (BID) | ORAL | 0 refills | Status: DC | PRN

## 2017-07-23 MED ORDER — IBUPROFEN 800 MG PO TABS: 800.0000 mg | ORAL_TABLET | Freq: Three times a day (TID) | ORAL | 0 refills | Status: DC

## 2017-07-23 MED ORDER — METHYLPREDNISOLONE ACETATE 80 MG/ML IJ SUSP
INTRAMUSCULAR | Status: AC
  Filled 2017-07-23: qty 1

## 2017-07-23 MED ORDER — METHYLPREDNISOLONE ACETATE 80 MG/ML IJ SUSP
80.0000 mg | Freq: Once | INTRAMUSCULAR | Status: AC
  Administered 2017-07-23: 80 mg via INTRAMUSCULAR

## 2017-07-23 MED ORDER — KETOROLAC TROMETHAMINE 60 MG/2ML IM SOLN
60.0000 mg | Freq: Once | INTRAMUSCULAR | Status: AC
  Administered 2017-07-23: 60 mg via INTRAMUSCULAR

## 2017-07-23 NOTE — Discharge Instructions (Signed)
We gave you a shot of Toradol and DepoMedrol today.   Use anti-inflammatories for pain/swelling. You may take up to 800 mg Ibuprofen every 8 hours with food. You may supplement Ibuprofen with Tylenol [phone] mg every 8 hours.   You may use flexeril as needed to help with pain. This is a muscle relaxer and causes sedation- please use only at bedtime or when you will be home and not have to drive/work.  Please work on neck exercises (attached) to help decrease stiffness  Please follow up if symptoms not improving or worsening.

## 2017-07-24 NOTE — ED Provider Notes (Signed)
[facility]    CSN: [id number] Arrival date & time: 07/23/17  1650     History   Chief Complaint Chief Complaint  Patient presents with  . Neck Pain    HPI Terri Winters is a 62 y.o. female history of hypertension, hyperlipidemia and asthma presenting today for evaluation of neck pain.  Patient denies any injury.  States that for the past week she has had worsening neck pain, notices mainly on both sides, occasionally will be worse on the left and on other times will be worse on the right.  Denies any numbness or tingling.  Denies any radiation into her arms.  Denies any headaches, change in vision or difficulty speaking.  She has tried Tylenol as well as heating pad without relief.  Denies history of diabetes or chronic kidney disease.  HPI  Past Medical History:  Diagnosis Date  . Asthma   . High cholesterol   . Hypertension     There are no active problems to display for this patient.   Past Surgical History:  Procedure Laterality Date  . CESAREAN SECTION      OB History   None      Home Medications    Prior to Admission medications   Medication Sig Start Date End Date Taking? Authorizing Provider  albuterol (PROVENTIL HFA;VENTOLIN HFA) 108 (90 Base) MCG/ACT inhaler Inhale 2 puffs into the lungs every 4 (four) hours as needed for wheezing or shortness of breath. 12/22/15  Yes Mabe, Onalee Hua, NP  cetirizine (ZYRTEC) 10 MG tablet Take 1 tablet (10 mg total) by mouth daily. 04/24/15  Yes Barbaraann Barthel, MD  diclofenac (VOLTAREN) 75 MG EC tablet Take 1 tablet (75 mg total) by mouth 2 (two) times daily. 03/02/17  Yes Hagler, Arlys John, MD  diclofenac sodium (VOLTAREN) 1 % GEL Apply 1 application topically 4 (four) times daily. 12/22/15  Yes Mabe, Onalee Hua, NP  lisinopril-hydrochlorothiazide (ZESTORETIC) 10-12.5 MG tablet Take 1 tablet by mouth daily. 12/22/15  Yes Mabe, Onalee Hua, NP  telmisartan (MICARDIS) 80 MG tablet Take 80 mg by mouth daily.   Yes  Emergency, Nurse, RN  cyclobenzaprine (FLEXERIL) 10 MG tablet Take 1 tablet (10 mg total) by mouth 2 (two) times daily as needed for muscle spasms. 07/23/17   Cassidee Deats C, PA-C  ibuprofen (ADVIL,MOTRIN) 800 MG tablet Take 1 tablet (800 mg total) by mouth 3 (three) times daily. 07/23/17   Jefferey Lippmann, Junius Creamer, PA-C    Family History Family History  Family history unknown: Yes    Social History Social History   Tobacco Use  . Smoking status: Never Smoker  . Smokeless tobacco: Never Used  Substance Use Topics  . Alcohol use: No  . Drug use: No     Allergies   Patient has no known allergies.   Review of Systems Review of Systems  Constitutional: Negative for activity change and appetite change.  HENT: Negative for trouble swallowing.   Eyes: Negative for pain and visual disturbance.  Respiratory: Negative for shortness of breath.   Cardiovascular: Negative for chest pain.  Gastrointestinal: Negative for abdominal pain, nausea and vomiting.  Musculoskeletal: Positive for myalgias, neck pain and neck stiffness. Negative for arthralgias, back pain and gait problem.  Skin: Negative for color change and wound.  Neurological: Negative for dizziness, seizures, syncope, weakness, light-headedness, numbness and headaches.     Physical Exam Triage Vital Signs ED Triage Vitals  Enc Vitals Group     BP 07/23/17 1716 (!) 161/106  Pulse Rate 07/23/17 1716 97     Resp 07/23/17 1716 18     Temp 07/23/17 1716 99.1 F (37.3 C)     Temp Source 07/23/17 1716 Temporal     SpO2 07/23/17 1716 100 %     Weight 07/23/17 1718 120 lb (54.4 kg)     Height 07/23/17 1718 5' (1.524 m)     Head Circumference --      Peak Flow --      Pain Score 07/23/17 1717 8     Pain Loc --      Pain Edu? --      Excl. in GC? --    No data found.  Updated Vital Signs BP (!) 161/106 (BP Location: Left Arm)   Pulse 97   Temp 99.1 F (37.3 C) (Temporal)   Resp 18   Ht 5' (1.524 m)   Wt 120 lb  (54.4 kg)   SpO2 100%   BMI 23.44 kg/m   Visual Acuity Right Eye Distance:   Left Eye Distance:   Bilateral Distance:    Right Eye Near:   Left Eye Near:    Bilateral Near:     Physical Exam  Constitutional: She appears well-developed and well-nourished. No distress.  HENT:  Head: Normocephalic and atraumatic.  Eyes: Conjunctivae are normal.  Neck: Neck supple.  Cardiovascular: Normal rate and regular rhythm.  No murmur heard. No carotid bruits auscultated  Pulmonary/Chest: Effort normal and breath sounds normal. No respiratory distress.  Abdominal: Soft. There is no tenderness.  Musculoskeletal: She exhibits no edema.  Patient sitting with head slightly tilted towards right, moves torso with and needing to look towards left or right.  Nontender to palpation over cervical spine midline, tenderness to palpation overlying bilateral sternocleidomastoid musculature.  Patient resisting passive range of motion of neck.  Neurological: She is alert.  Skin: Skin is warm and dry.  Psychiatric: She has a normal mood and affect.  Nursing note and vitals reviewed.    UC Treatments / Results  Labs (all labs ordered are listed, but only abnormal results are displayed) Labs Reviewed - No data to display  EKG None  Radiology No results found.  Procedures Procedures (including critical care time)  Medications Ordered in UC Medications  ketorolac (TORADOL) injection 60 mg (60 mg Intramuscular Given 07/23/17 1839)  methylPREDNISolone acetate (DEPO-MEDROL) injection 80 mg (80 mg Intramuscular Given 07/23/17 1837)    Initial Impression / Assessment and Plan / UC Course  I have reviewed the triage vital signs and the nursing notes.  Pertinent labs & imaging results that were available during my care of the patient were reviewed by me and considered in my medical decision making (see chart for details).     Patient with acute torticollis, no injury.  No plaques.  Patient has  tension in her sternocleidomastoid muscles.  At this time will provide Toradol and Depo-Medrol.  Will send home to continue anti-inflammatories as well as muscle relaxer.  Discussed slowly working on neck exercises and increasing range of motion.Discussed strict return precautions. Patient verbalized understanding and is agreeable with plan.  Final Clinical Impressions(s) / UC Diagnoses   Final diagnoses:  Torticollis, acute     Discharge Instructions     We gave you a shot of Toradol and DepoMedrol today.   Use anti-inflammatories for pain/swelling. You may take up to 800 mg Ibuprofen every 8 hours with food. You may supplement Ibuprofen with Tylenol [phone] mg every 8 hours.  You may use flexeril as needed to help with pain. This is a muscle relaxer and causes sedation- please use only at bedtime or when you will be home and not have to drive/work.  Please work on neck exercises (attached) to help decrease stiffness  Please follow up if symptoms not improving or worsening.    ED Prescriptions    Medication Sig Dispense Auth. Provider   ibuprofen (ADVIL,MOTRIN) 800 MG tablet Take 1 tablet (800 mg total) by mouth 3 (three) times daily. 30 tablet Sharran Caratachea C, PA-C   cyclobenzaprine (FLEXERIL) 10 MG tablet Take 1 tablet (10 mg total) by mouth 2 (two) times daily as needed for muscle spasms. 20 tablet Tavie Haseman, GibbonHallie C, PA-C     Controlled Substance Prescriptions [place] Controlled Substance Registry consulted? Not Applicable   Lew DawesWieters, Jullie Arps C, [place] JerseyPA-C 07/24/17 1154

## 2017-07-27 ENCOUNTER — Encounter (HOSPITAL_COMMUNITY): Payer: Self-pay | Admitting: Family Medicine

## 2017-07-27 ENCOUNTER — Ambulatory Visit (HOSPITAL_COMMUNITY)
Admission: EM | Admit: 2017-07-27 | Discharge: 2017-07-27 | Disposition: A | Discharge status: Home / Self Care | Payer: Medicaid Other | Attending: Internal Medicine | Admitting: Internal Medicine

## 2017-07-27 DIAGNOSIS — M62838 Other muscle spasm: Secondary | ICD-10-CM

## 2017-07-27 DIAGNOSIS — M542 Cervicalgia: Secondary | ICD-10-CM

## 2017-07-27 MED ORDER — PREDNISONE 10 MG PO TABS: 20.0000 mg | ORAL_TABLET | Freq: Two times a day (BID) | ORAL | 0 refills | Status: AC

## 2017-07-27 MED ORDER — TRAMADOL HCL 50 MG PO TABS: 50.0000 mg | ORAL_TABLET | Freq: Two times a day (BID) | ORAL | 0 refills | Status: AC | PRN

## 2017-07-27 NOTE — ED Triage Notes (Signed)
Pt here for left sided neck pain radiating into ear and head. She was seen here this week and given muscle relaxer and ibuprofen but the pain has worsened.

## 2017-07-27 NOTE — ED Provider Notes (Signed)
[facility]   [id number] 07/27/17 Arrival Time: 1731  SUBJECTIVE: History from: patient. Yariah Selvey is a 62 y.o. female complains of neck pain that began 1 week ago.  Was seen at [facility] on 07/23/17 and diagnosed with torticollis and treated with toradol and depo shot in office, as well as ibuprofen and flexeril prescription.  Denies a precipitating event or specific injury.  Localizes the pain to the left side of neck.  Describes the pain as constant and sharp in character.  Has tried ibuprofen and flexeril without relief.  Symptoms are made worse with ROM.  Denies similar symptoms in the past.  Denies fever, chills, nausea, vomiting, chest pain, SOB, erythema, ecchymosis, effusion, weakness, numbness and tingling.      ROS: As per HPI.  Past Medical History:  Diagnosis Date  . Asthma   . High cholesterol   . Hypertension    Past Surgical History:  Procedure Laterality Date  . CESAREAN SECTION     No Known Allergies No current facility-administered medications on file prior to encounter.    Current Outpatient Medications on File Prior to Encounter  Medication Sig Dispense Refill  . albuterol (PROVENTIL HFA;VENTOLIN HFA) 108 (90 Base) MCG/ACT inhaler Inhale 2 puffs into the lungs every 4 (four) hours as needed for wheezing or shortness of breath. 1 Inhaler 0  . cetirizine (ZYRTEC) 10 MG tablet Take 1 tablet (10 mg total) by mouth daily. 30 tablet 0  . cyclobenzaprine (FLEXERIL) 10 MG tablet Take 1 tablet (10 mg total) by mouth 2 (two) times daily as needed for muscle spasms. 20 tablet 0  . diclofenac (VOLTAREN) 75 MG EC tablet Take 1 tablet (75 mg total) by mouth 2 (two) times daily. 14 tablet 0  . diclofenac sodium (VOLTAREN) 1 % GEL Apply 1 application topically 4 (four) times daily. 100 g 0  . ibuprofen (ADVIL,MOTRIN) 800 MG tablet Take 1 tablet (800 mg total) by mouth 3 (three) times daily. 30 tablet 0  . lisinopril-hydrochlorothiazide (ZESTORETIC) 10-12.5 MG  tablet Take 1 tablet by mouth daily. 30 tablet 0  . telmisartan (MICARDIS) 80 MG tablet Take 80 mg by mouth daily.     Social History   Socioeconomic History  . Marital status: Divorced    Spouse name: Not on file  . Number of children: Not on file  . Years of education: Not on file  . Highest education level: Not on file  Occupational History  . Not on file  Social Needs  . Financial resource strain: Not on file  . Food insecurity:    Worry: Not on file    Inability: Not on file  . Transportation needs:    Medical: Not on file    Non-medical: Not on file  Tobacco Use  . Smoking status: Never Smoker  . Smokeless tobacco: Never Used  Substance and Sexual Activity  . Alcohol use: No  . Drug use: No  . Sexual activity: Not on file  Lifestyle  . Physical activity:    Days per week: Not on file    Minutes per session: Not on file  . Stress: Not on file  Relationships  . Social connections:    Talks on phone: Not on file    Gets together: Not on file    Attends religious service: Not on file    Active member of club or organization: Not on file    Attends meetings of clubs or organizations: Not on file    Relationship  status: Not on file  . Intimate partner violence:    Fear of current or ex partner: Not on file    Emotionally abused: Not on file    Physically abused: Not on file    Forced sexual activity: Not on file  Other Topics Concern  . Not on file  Social History Narrative  . Not on file   Family History  Family history unknown: Yes    OBJECTIVE:  Vitals:   07/27/17 1749  BP: (!) 147/88  Pulse: 80  Resp: 18  Temp: 98.5 F (36.9 C)  SpO2: 100%    General appearance: AOx3; in no acute distress.  Head: NCAT Lungs: CTA bilaterally Heart: RRR.  Clear S1 and S2 without murmur, gallops, or rubs.  Radial pulses 2+ bilaterally. Musculoskeletal: Neck Inspection: Skin warm, dry, clear and intact without obvious erythema, effusion, or ecchymosis.    Palpation: Tender to palpation about the superior aspect on the left side of the trapezius ROM: LROM Strength: 5/5 shld abduction, 5/5 shld adduction, 5/5 elbow flexion, 5/5 elbow extension, 5/5 grip strength Skin: warm and dry Neurologic: Ambulates without difficulty; CN 2-12 grossly intact; Nose to finger without difficulty; Sensation intact about the upper/ lower extremities Psychological: alert and cooperative; normal mood and affect  ASSESSMENT & PLAN:  1. Muscle spasm   2. Neck pain     No orders of the defined types were placed in this encounter.   Continue conservative management of rest, ice, heat and gentle stretches Continue to use ibuprofen and flexeril as prescribed.  Use tramadol as needed for break-through or severe pain Follow up with PCP if symptoms persist Return or go to the ER if you have any new or worsening symptoms (fever, chills, chest pain, abdominal pain, changes in bowel or bladder habits, pain radiating into lower legs, etc...)   [place] Controlled Substances Registry consulted for this patient. I feel the risk/benefit ratio today is favorable for proceeding with this prescription for a controlled substance. Medication sedation precautions given.  Reviewed expectations re: course of current medical issues. Questions answered. Outlined signs and symptoms indicating need for more acute intervention. Patient verbalized understanding. After Visit Summary given.    Rennis HardingWurst, Md Smola, PA-C 07/27/17 1842

## 2017-07-27 NOTE — Discharge Instructions (Signed)
Continue conservative management of rest, ice, heat and gentle stretches Continue to use ibuprofen and flexeril as prescribed.  Use tramadol as needed for break-through or severe pain Follow up with PCP if symptoms persist Return or go to the ER if you have any new or worsening symptoms (fever, chills, chest pain, abdominal pain, changes in bowel or bladder habits, pain radiating into lower legs, etc...)

## 2017-09-22 ENCOUNTER — Encounter: Payer: Self-pay | Admitting: Cardiovascular Disease

## 2017-10-17 ENCOUNTER — Encounter: Payer: Self-pay | Admitting: Cardiovascular Disease

## 2017-10-17 ENCOUNTER — Ambulatory Visit (INDEPENDENT_AMBULATORY_CARE_PROVIDER_SITE_OTHER): Payer: Medicaid Other | Admitting: Cardiovascular Disease

## 2017-10-17 VITALS — BP 150/110 | HR 79 | Ht 60.0 in | Wt 122.0 lb

## 2017-10-17 DIAGNOSIS — I10 Essential (primary) hypertension: Secondary | ICD-10-CM | POA: Diagnosis not present

## 2017-10-17 LAB — BASIC METABOLIC PANEL
BUN / CREAT RATIO: 18 (ref 12–28)
BUN: 13 mg/dL (ref 8–27)
CHLORIDE: 99 mmol/L (ref 96–106)
CO2: 27 mmol/L (ref 20–29)
Calcium: 9.8 mg/dL (ref 8.7–10.3)
Creatinine, Ser: 0.72 mg/dL (ref 0.57–1.00)
GFR calc Af Amer: 102 mL/min/{1.73_m2} (ref >59)
GFR calc non Af Amer: 89 mL/min/{1.73_m2} (ref >59)
GLUCOSE: 42 mg/dL — AB (ref 65–99)
Potassium: 3.6 mmol/L (ref 3.5–5.2)
SODIUM: 141 mmol/L (ref 134–144)

## 2017-10-17 MED ORDER — AMLODIPINE BESYLATE 5 MG PO TABS: 5.0000 mg | ORAL_TABLET | Freq: Every day | ORAL | 3 refills | Status: DC

## 2017-10-17 MED ORDER — SPIRONOLACTONE 25 MG PO TABS: 25.0000 mg | ORAL_TABLET | Freq: Every day | ORAL | 3 refills | Status: DC

## 2017-10-17 NOTE — Progress Notes (Signed)
Cardiology Office Note:    Date:  10/17/2017   ID:  Terri Winters, DOB Jun 18, 1955, MRN [id number]  PCP:  Laurann Montana, MD  Cardiologist:  Kristeen Miss, MD   Referring MD: Laurann Montana, MD   Chief Complaint  Patient presents with  . Hypertension     History of Present Illness:    Ta Fair is a 62 y.o. female with a hx of essential hypertension. We asked her to see her today for further evaluation and management of her hypertension by Dr. Cliffton Asters.   Has had HTN for 7 years ago  Does not eat salt .  Does not eat salty foods.  Eats at home mostly .    Strong family hx of HTN .   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  She currently is on HCTZ 50 mg a day and losartan 100 mg a day.  Past Medical History:  Diagnosis Date  . Asthma   . High cholesterol   . Hypertension   . Syncope     Past Surgical History:  Procedure Laterality Date  . CESAREAN SECTION      Current Medications: Current Meds  Medication Sig  . albuterol (PROVENTIL HFA;VENTOLIN HFA) 108 (90 Base) MCG/ACT inhaler Inhale 2 puffs into the lungs every 4 (four) hours as needed for wheezing or shortness of breath.  . cetirizine (ZYRTEC) 10 MG tablet Take 1 tablet (10 mg total) by mouth daily.  . cyclobenzaprine (FLEXERIL) 10 MG tablet Take 1 tablet (10 mg total) by mouth 2 (two) times daily as needed for muscle spasms.  . diclofenac (VOLTAREN) 75 MG EC tablet Take 1 tablet (75 mg total) by mouth 2 (two) times daily.  . diclofenac sodium (VOLTAREN) 1 % GEL Apply 1 application topically 4 (four) times daily.  Marland Kitchen ibuprofen (ADVIL,MOTRIN) 800 MG tablet Take 1 tablet (800 mg total) by mouth 3 (three) times daily.  . [DISCONTINUED] lisinopril-hydrochlorothiazide (ZESTORETIC) 10-12.5 MG tablet Take 1 tablet by mouth daily.  . [DISCONTINUED] telmisartan (MICARDIS) 80 MG tablet Take 80 mg  by mouth daily.     Allergies:   Patient has no known allergies.   Social History   Socioeconomic History  . Marital status: Divorced    Spouse name: Not on file  . Number of children: Not on file  . Years of education: Not on file  . Highest education level: Not on file  Occupational History  . Not on file  Social Needs  . Financial resource strain: Not on file  . Food insecurity:    Worry: Not on file    Inability: Not on file  . Transportation needs:    Medical: Not on file    Non-medical: Not on file  Tobacco Use  . Smoking status: Never Smoker  . Smokeless tobacco: Never Used  Substance and Sexual Activity  . Alcohol use: No  . Drug use: No  . Sexual activity: Not on file  Lifestyle  . Physical activity:    Days per week: Not on file    Minutes per session: Not on file  . Stress: Not on file  Relationships  . Social connections:    Talks on phone: Not on file    Gets together: Not on file    Attends religious service: Not on file    Active member of club or organization: Not on  file    Attends meetings of clubs or organizations: Not on file    Relationship status: Not on file  Other Topics Concern  . Not on file  Social History Narrative  . Not on file     Family History: The patient's Family history is unknown by patient.  ROS:   Please see the history of present illness.     All other systems reviewed and are negative.  EKGs/Labs/Other Studies Reviewed:    The following studies were reviewed today:   EKG: September not, 2019: Normal sinus rhythm at 79.  Normal EKG.   Recent Labs: 01/06/2017: BUN 11; Creatinine, Ser 0.58; Hemoglobin 11.7; Platelets 220; Potassium 3.6; Sodium 137  Recent Lipid Panel No results found for: CHOL, TRIG, HDL, CHOLHDL, VLDL, LDLCALC, LDLDIRECT  Physical Exam:    VS:  BP (!) 150/110   Pulse 79   Ht 5' (1.524 m)   Wt 122 lb (55.3 kg)   SpO2 98%   BMI 23.83 kg/m     Wt Readings from Last 3 Encounters:    10/17/17 122 lb (55.3 kg)  07/23/17 120 lb (54.4 kg)  01/06/17 118 lb (53.5 kg)     GEN:  Well nourished, well developed in no acute distress HEENT: Normal NECK: No JVD; No carotid bruits LYMPHATICS: No lymphadenopathy CARDIAC:    RR   RESPIRATORY:  Clear to auscultation without rales, wheezing or rhonchi  ABDOMEN: Soft, non-tender, non-distended MUSCULOSKELETAL:  No edema; No deformity  SKIN: Warm and dry NEUROLOGIC:  Alert and oriented x 3 PSYCHIATRIC:  Normal affect   ASSESSMENT:    1. Essential hypertension    PLAN:    In order of problems listed above:  1. Hypertension: Faina presents for further evaluation of her hypertension.  She is currently on HCTZ 50 mg a day and losartan 100 mg a day.  Her blood pressure remains elevated.  I think she will do better on Aldactone and amlodipine.  We will discontinue the HCTZ and losartan.  Will start Aldactone 25 mg a day and amlodipine 5 mg a day  BMP in 2 weeks  Will see her in 2 months . She already eats a fairly low-salt diet.  Of advised her to continue low-salt diet.  She needs to exercise regularly.   Medication Adjustments/Labs and Tests Ordered: Current medicines are reviewed at length with the patient today.  Concerns regarding medicines are outlined above.  Orders Placed This Encounter  Procedures  . Basic Metabolic Panel (BMET)  . Basic Metabolic Panel (BMET)  . EKG 12-Lead   Meds ordered this encounter  Medications  . spironolactone (ALDACTONE) 25 MG tablet    Sig: Take 1 tablet (25 mg total) by mouth daily.    Dispense:  90 tablet    Refill:  3  . amLODipine (NORVASC) 5 MG tablet    Sig: Take 1 tablet (5 mg total) by mouth daily.    Dispense:  90 tablet    Refill:  3    Patient Instructions  Medication Instructions:  Your physician has recommended you make the following change in your medication:   STOP Losartan STOP Lisinopril/HCTZ STOP Hydrochlorothiazide (HCTZ) START Aldactone  (Spironolactone) 25 mg once daily START Amlodipine (Norvasc) 5 mg once daily   Labwork: TODAY - basic metabolic panel  Your physician recommends that you return for lab work in: 2 weeks for basic metabolic panel   Testing/Procedures: None Ordered   Follow-Up: Your physician recommends that you schedule a follow-up  appointment in: 2 months with Dr. Elease Hashimoto   If you need a refill on your cardiac medications before your next appointment, please call your pharmacy.   Thank you for choosing [facility]! Eligha Bridegroom, RN [phone]       Signed, Kristeen Miss, MD  10/17/2017 11:04 AM    [facility]

## 2017-10-17 NOTE — Patient Instructions (Signed)
Medication Instructions:  Your physician has recommended you make the following change in your medication:   STOP Losartan STOP Lisinopril/HCTZ STOP Hydrochlorothiazide (HCTZ) START Aldactone (Spironolactone) 25 mg once daily START Amlodipine (Norvasc) 5 mg once daily   Labwork: TODAY - basic metabolic panel  Your physician recommends that you return for lab work in: 2 weeks for basic metabolic panel   Testing/Procedures: None Ordered   Follow-Up: Your physician recommends that you schedule a follow-up appointment in: 2 months with Dr. Elease Hashimoto   If you need a refill on your cardiac medications before your next appointment, please call your pharmacy.   Thank you for choosing [facility]! Eligha Bridegroom, RN [phone]

## 2017-10-31 ENCOUNTER — Other Ambulatory Visit: Payer: Medicaid Other | Admitting: *Deleted

## 2017-10-31 DIAGNOSIS — I10 Essential (primary) hypertension: Secondary | ICD-10-CM

## 2017-10-31 LAB — BASIC METABOLIC PANEL
BUN/Creatinine Ratio: 18 (ref 12–28)
BUN: 14 mg/dL (ref 8–27)
CO2: 26 mmol/L (ref 20–29)
Calcium: 9.7 mg/dL (ref 8.7–10.3)
Chloride: 99 mmol/L (ref 96–106)
Creatinine, Ser: 0.77 mg/dL (ref 0.57–1.00)
GFR calc Af Amer: 94 mL/min/{1.73_m2} (ref >59)
GFR calc non Af Amer: 82 mL/min/{1.73_m2} (ref >59)
GLUCOSE: 80 mg/dL (ref 65–99)
POTASSIUM: 4.5 mmol/L (ref 3.5–5.2)
SODIUM: 139 mmol/L (ref 134–144)

## 2017-12-20 ENCOUNTER — Ambulatory Visit: Payer: Medicaid Other | Admitting: Cardiovascular Disease

## 2018-01-11 ENCOUNTER — Ambulatory Visit (INDEPENDENT_AMBULATORY_CARE_PROVIDER_SITE_OTHER): Payer: Medicaid Other | Admitting: Cardiovascular Disease

## 2018-01-11 ENCOUNTER — Encounter: Payer: Self-pay | Admitting: Cardiovascular Disease

## 2018-01-11 VITALS — BP 158/94 | HR 94 | Ht 60.0 in | Wt 123.0 lb

## 2018-01-11 DIAGNOSIS — I10 Essential (primary) hypertension: Secondary | ICD-10-CM | POA: Diagnosis not present

## 2018-01-11 NOTE — Progress Notes (Signed)
Cardiology Office Note:    Date:  01/11/2018   ID:  Terri Winters, DOB 1955/08/16, MRN [id number]  PCP:  Laurann Montana, MD  Cardiologist:  Kristeen Miss, MD   Referring MD: Laurann Montana, MD   Chief Complaint  Patient presents with  . Hypertension      Terri Winters is a 62 y.o. female with a hx of essential hypertension. We asked her to see her today for further evaluation and management of her hypertension by Dr. Cliffton Asters.   Has had HTN for 7 years ago  Does not eat salt .  Does not eat salty foods.  Eats at home mostly .    Strong family hx of HTN .   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  She currently is on HCTZ 50 mg a day and losartan 100 mg a day.  Dec. 4, 2019:  Terri Winters is seen back for her hypertension today.  We started her on Aldactone and amlodipine at her last office visit.  Blood pressure is better but  remains mildly elevated. Is watching her salt.   She is recently been seen by her primary medical doctor.  Dr. Cliffton Asters made some changes but Terri Winters does not know what they are and does not have the new pills.  She did not bring any of her medicine bottles.    Past Medical History:  Diagnosis Date  . Asthma   . High cholesterol   . Hypertension   . Syncope     Past Surgical History:  Procedure Laterality Date  . CESAREAN SECTION      Current Medications: Current Meds  Medication Sig  . albuterol (PROVENTIL HFA;VENTOLIN HFA) 108 (90 Base) MCG/ACT inhaler Inhale 2 puffs into the lungs every 4 (four) hours as needed for wheezing or shortness of breath.  Marland Kitchen amLODipine (NORVASC) 5 MG tablet Take 1 tablet (5 mg total) by mouth daily.  . cetirizine (ZYRTEC) 10 MG tablet Take 1 tablet (10 mg total) by mouth daily.  . cyclobenzaprine (FLEXERIL) 10 MG tablet Take 1 tablet (10 mg total) by mouth 2 (two) times daily as needed for  muscle spasms.  . diclofenac (VOLTAREN) 75 MG EC tablet Take 1 tablet (75 mg total) by mouth 2 (two) times daily.  . diclofenac sodium (VOLTAREN) 1 % GEL Apply 1 application topically 4 (four) times daily.  Marland Kitchen ibuprofen (ADVIL,MOTRIN) 800 MG tablet Take 1 tablet (800 mg total) by mouth 3 (three) times daily.  Marland Kitchen spironolactone (ALDACTONE) 25 MG tablet Take 1 tablet (25 mg total) by mouth daily.     Allergies:   Patient has no known allergies.   Social History   Socioeconomic History  . Marital status: Divorced    Spouse name: Not on file  . Number of children: Not on file  . Years of education: Not on file  . Highest education level: Not on file  Occupational History  . Not on file  Social Needs  . Financial resource strain: Not on file  . Food insecurity:    Worry: Not on file    Inability: Not on file  . Transportation needs:    Medical: Not on file    Non-medical: Not on file  Tobacco Use  . Smoking status: Never Smoker  . Smokeless tobacco: Never Used  Substance and Sexual Activity  . Alcohol use: No  .  Drug use: No  . Sexual activity: Not on file  Lifestyle  . Physical activity:    Days per week: Not on file    Minutes per session: Not on file  . Stress: Not on file  Relationships  . Social connections:    Talks on phone: Not on file    Gets together: Not on file    Attends religious service: Not on file    Active member of club or organization: Not on file    Attends meetings of clubs or organizations: Not on file    Relationship status: Not on file  Other Topics Concern  . Not on file  Social History Narrative  . Not on file     Family History: The patient's Family history is unknown by patient.  ROS:   Please see the history of present illness.     All other systems reviewed and are negative.  EKGs/Labs/Other Studies Reviewed:    The following studies were reviewed today:   EKG: September not, 2019: Normal sinus rhythm at 79.  Normal  EKG.   Recent Labs: 10/31/2017: BUN 14; Creatinine, Ser 0.77; Potassium 4.5; Sodium 139  Recent Lipid Panel No results found for: CHOL, TRIG, HDL, CHOLHDL, VLDL, LDLCALC, LDLDIRECT  Physical Exam:    VS:  BP (!) 158/94   Pulse 94   Ht 5' (1.524 m)   Wt 123 lb (55.8 kg)   SpO2 99%   BMI 24.02 kg/m     Wt Readings from Last 3 Encounters:  01/11/18 123 lb (55.8 kg)  10/17/17 122 lb (55.3 kg)  07/23/17 120 lb (54.4 kg)     GEN: Middle-aged female, no acute distress. HEENT: Normal NECK: No JVD; No carotid bruits LYMPHATICS: No lymphadenopathy CARDIAC:    RR   RESPIRATORY:  Clear to auscultation without rales, wheezing or rhonchi  ABDOMEN: Soft, non-tender, non-distended MUSCULOSKELETAL:  No edema; No deformity  SKIN: Warm and dry NEUROLOGIC:  Alert and oriented x 3 PSYCHIATRIC:  Normal affect   ASSESSMENT:    1. Benign essential HTN    PLAN:    In order of problems listed above:  Hypertension: Terri Winters's  blood pressure is clearly better but is still mildly elevated.  She recently was seen by her primary medical doctor  I am not entirely convinced that we have an updated med list at present.  We will have her come back and see [name], PA in 3 months.  To bring all of her medications with her.   Medication Adjustments/Labs and Tests Ordered: Current medicines are reviewed at length with the patient today.  Concerns regarding medicines are outlined above.  No orders of the defined types were placed in this encounter.  No orders of the defined types were placed in this encounter.   Patient Instructions  Medication Instructions:  Your physician recommends that you continue on your current medications as directed. Please refer to the Current Medication list given to you today.  If you need a refill on your cardiac medications before your next appointment, please call your pharmacy.   Lab work: None Ordered    Testing/Procedures: None  Ordered   Follow-Up: At [facility], you and your health needs are our priority.  As part of our continuing mission to provide you with exceptional heart care, we have created designated Provider Care Teams.  These Care Teams include your primary Cardiologist (physician) and Advanced Practice Providers (APPs -  Physician Assistants and Nurse Practitioners) who all work  together to provide you with the care you need, when you need it. You will need a follow up appointment in:  3 months.  You may see Kristeen MissPhilip Bonner Larue, MD or one of the following Advanced Practice Providers on your designated Care Team: Tereso NewcomerScott Weaver, PA-C Vin SabinBhagat, [place]-C . Berton BonJanine Hammond, NP      Signed, Kristeen MissPhilip Bexlee Bergdoll, MD  01/11/2018 6:42 PM    [facility]

## 2018-01-11 NOTE — Patient Instructions (Signed)
Medication Instructions:  Your physician recommends that you continue on your current medications as directed. Please refer to the Current Medication list given to you today.  If you need a refill on your cardiac medications before your next appointment, please call your pharmacy.    Lab work: None Ordered   Testing/Procedures: None Ordered   Follow-Up: At [facility], you and your health needs are our priority.  As part of our continuing mission to provide you with exceptional heart care, we have created designated Provider Care Teams.  These Care Teams include your primary Cardiologist (physician) and Advanced Practice Providers (APPs -  Physician Assistants and Nurse Practitioners) who all work together to provide you with the care you need, when you need it. You will need a follow up appointment in:  3 months.  You may see Philip Nahser, MD or one of the following Advanced Practice Providers on your designated Care Team: Scott Weaver, PA-C Vin Bhagat, PA-C . Janine Hammond, NP   

## 2018-01-21 IMAGING — DX DG CHEST 2V
2 series · 2 of 2 positions shown · non-contrast
Comparison: None.

CLINICAL DATA: Cough for 3 weeks

EXAM:
CHEST  2 VIEW

[chest pa]
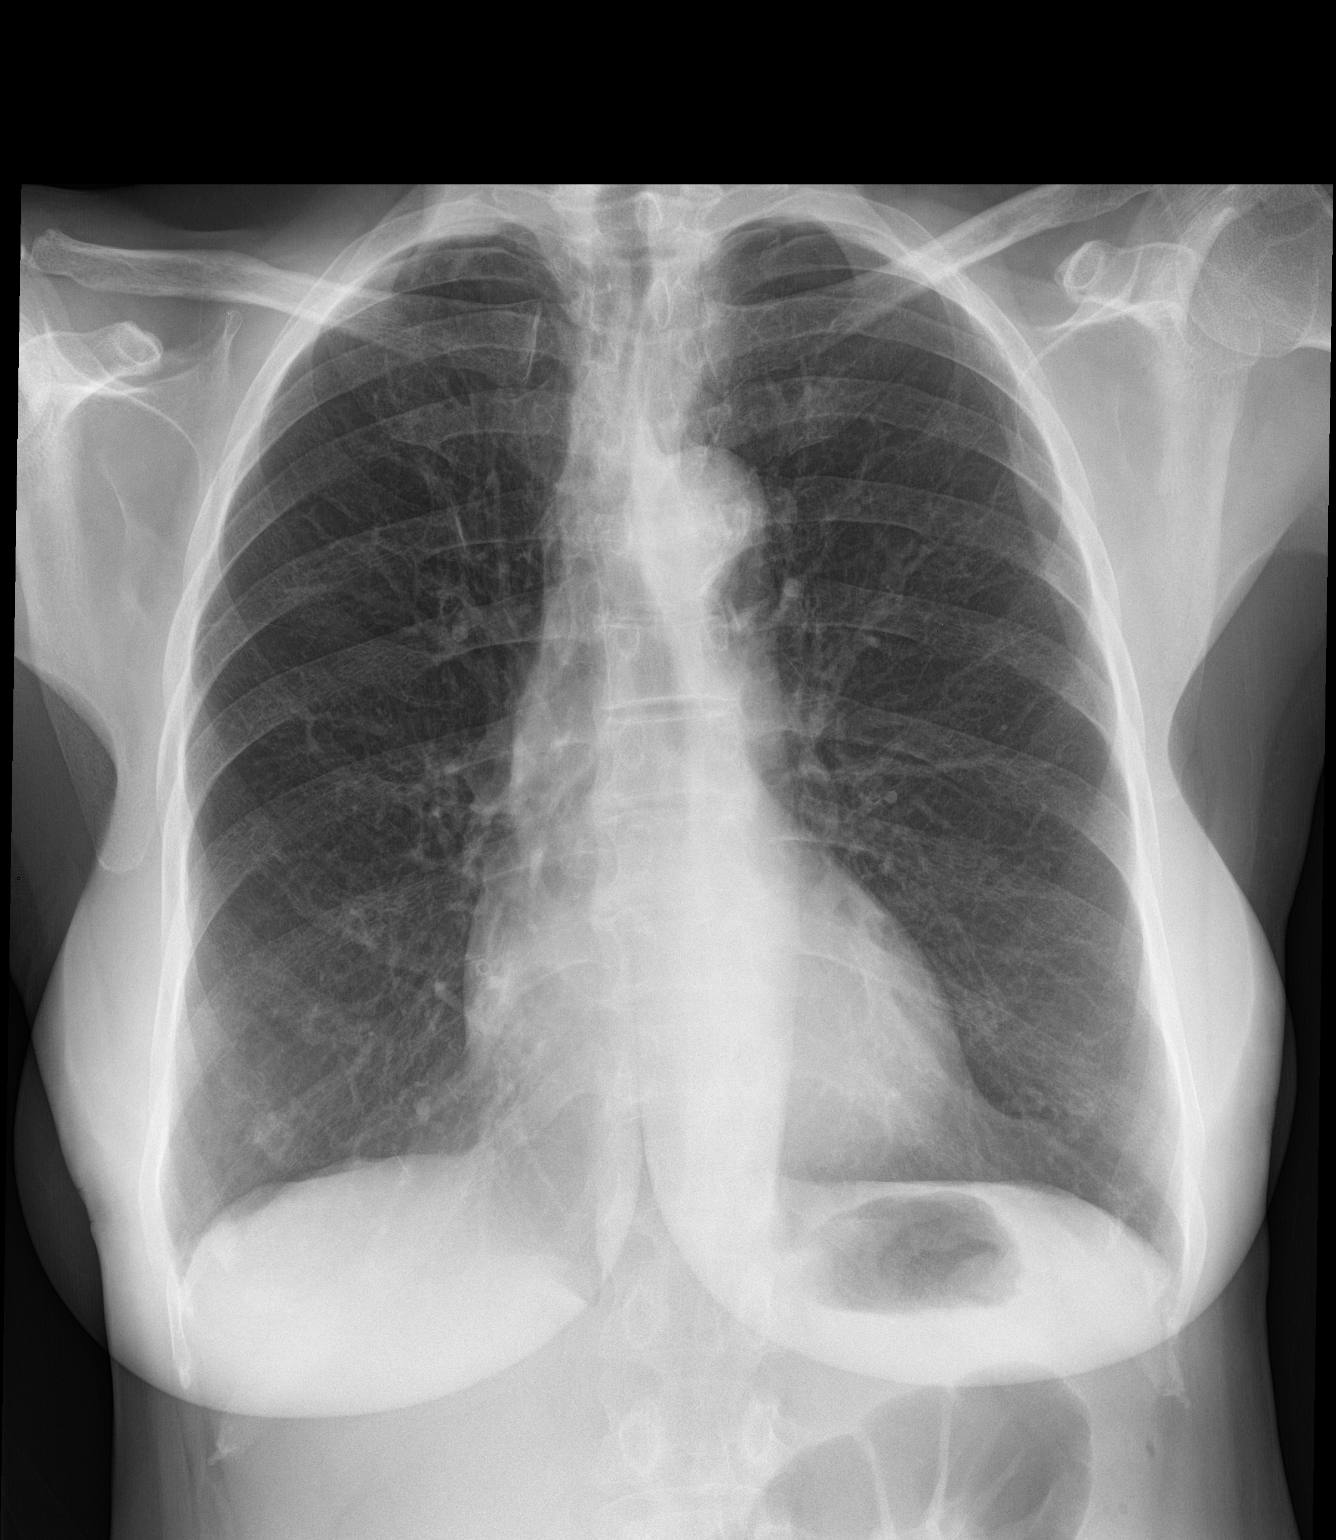

[chest lat]
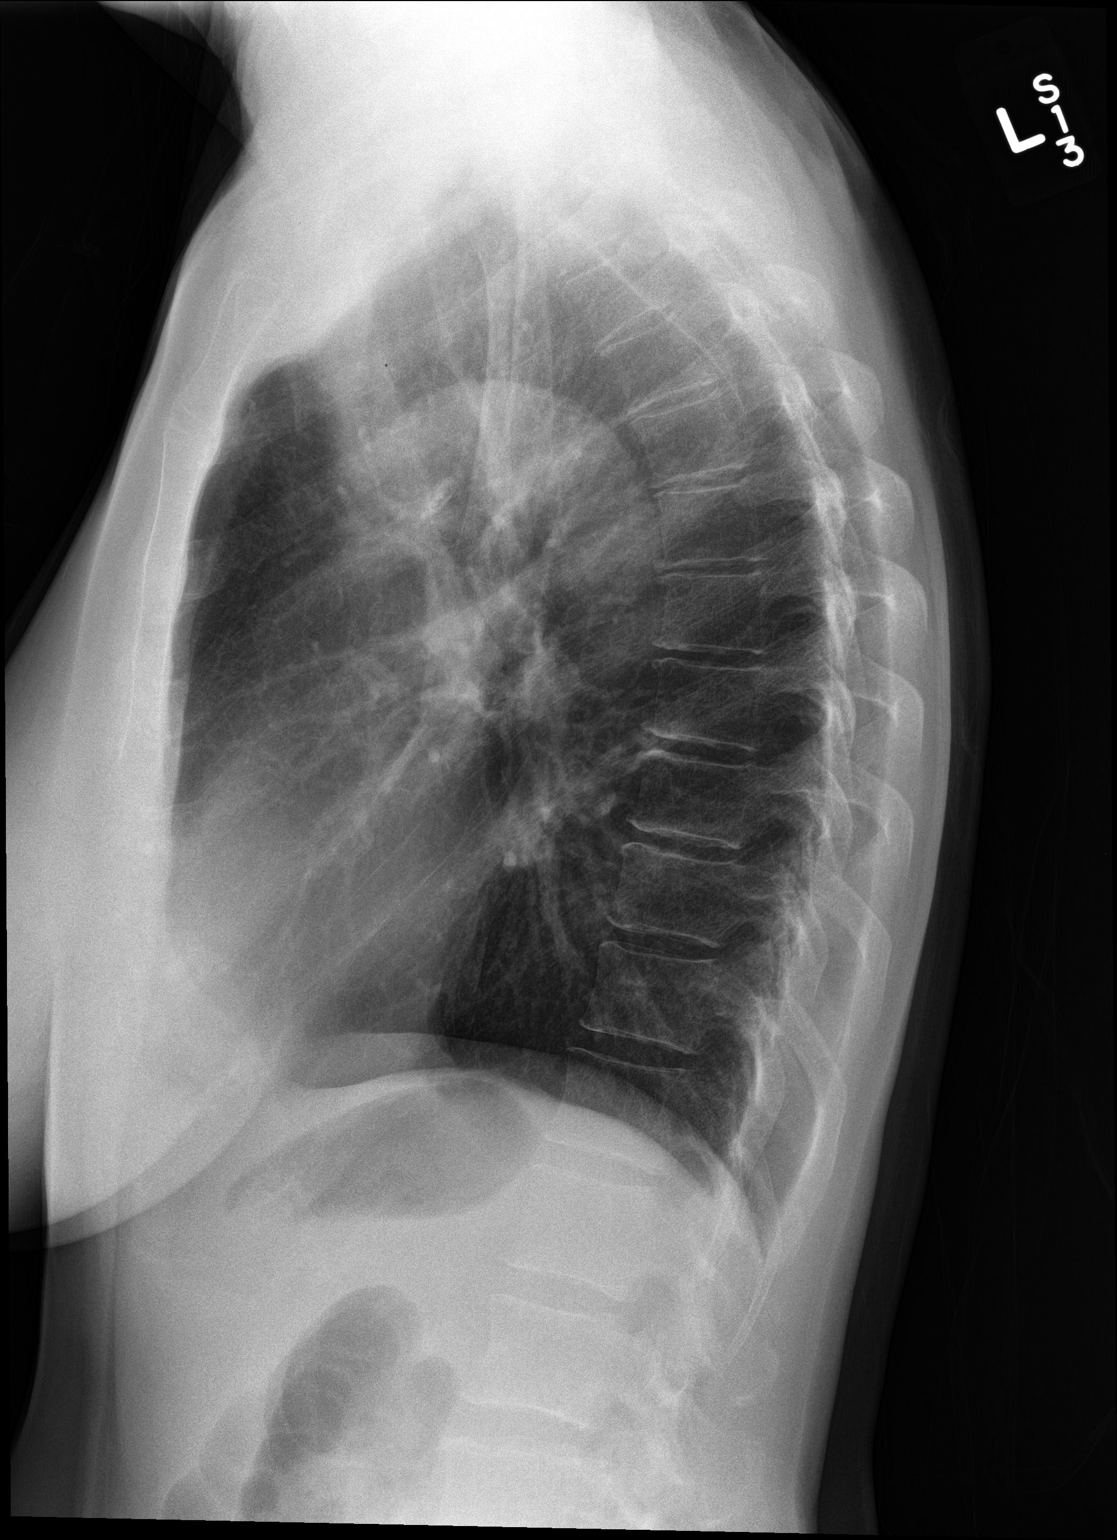

[2 of 2 positions shown; findings below may reference images not displayed]

FINDINGS: There is no edema or consolidation. The heart size and pulmonary
vascularity are normal. There is atherosclerotic calcification in
the aortic arch region. No adenopathy. No bone lesions.
IMPRESSION: No edema or consolidation.

## 2018-04-24 ENCOUNTER — Encounter (HOSPITAL_COMMUNITY): Payer: Self-pay | Admitting: Emergency Medicine

## 2018-04-24 ENCOUNTER — Telehealth: Payer: Self-pay | Admitting: Nurse Practitioner

## 2018-04-24 ENCOUNTER — Ambulatory Visit (HOSPITAL_COMMUNITY)
Admission: EM | Admit: 2018-04-24 | Discharge: 2018-04-24 | Disposition: A | Discharge status: Home / Self Care | Payer: Medicaid Other | Attending: Family Medicine | Admitting: Family Medicine

## 2018-04-24 ENCOUNTER — Other Ambulatory Visit: Payer: Self-pay

## 2018-04-24 DIAGNOSIS — R1030 Lower abdominal pain, unspecified: Secondary | ICD-10-CM

## 2018-04-24 NOTE — Telephone Encounter (Signed)
Left message for patient to call back regarding her appointment with Dr. Nahser this week due to Covid 19 

## 2018-04-24 NOTE — Discharge Instructions (Signed)
Your symptoms are mostly likely from a a viral stomach bug. Your symptoms should improve over the next few days as your body continues to rid the infectious cause.  For nausea: Start with clear liquids, then move to plain foods like bananas, rice, applesauce, toast, broth, grits, oatmeal. As those food settle okay you may transition to your normal foods. Avoid spicy and greasy foods as much as possible.  For Diarrhea: This is your body's natural way of getting rid of a virus. You may try taking 1 imodium or pepto bismol to decrease amount of stools a day, but we do not want you to stop your diarrhea.   Preventing dehydration is key! You need to replace the fluid your body is expelling. Drink plenty of fluids, may use Pedialyte or sports drinks.   Please return if you are experiencing blood in your vomit or stool or experiencing dizziness, lightheadedness, extreme fatigue, increased abdominal pain, fever.

## 2018-04-24 NOTE — ED Triage Notes (Signed)
Complains of abdominal pain that started yesterday.  Reports having abdominal pain about an hour after eating at [employer].  Patient had diarrhea yesterday, none today

## 2018-04-25 NOTE — ED Provider Notes (Signed)
[facility]    CSN: [id number] Arrival date & time: 04/24/18  1232     History   Chief Complaint Chief Complaint  Patient presents with  . Abdominal Pain    HPI Terri Winters is a 63 y.o. female history of asthma, hypertension presenting today for evaluation of abdominal pain.  Patient states that yesterday she ate a chicken pot pie from [facility].  2 hours later she started developed some abdominal discomfort and had approximately 5 episodes of diarrhea.  That diarrhea has resolved.  She does admit that she took some Pepto-Bismol.  Bowel movements have been normal today.  She is continued to have some very mild abdominal pain in her lower abdomen.  Pain is rated a 1 out of 10.  At time of visit she denies pain currently.  Nausea or vomiting.  She has tried some fluids as well as some crackers.  Denies associated URI symptoms of cough, congestion or sore throat.  Denies any fevers.  Denies any recent travel.  Has had previous C-sections, but denies other surgeries on abdomen.  Denies pelvic pain, spotting or discharge.  HPI  Past Medical History:  Diagnosis Date  . Asthma   . High cholesterol   . Hypertension   . Syncope     There are no active problems to display for this patient.   Past Surgical History:  Procedure Laterality Date  . CESAREAN SECTION      OB History   No obstetric history on file.      Home Medications    Prior to Admission medications   Medication Sig Start Date End Date Taking? Authorizing Provider  albuterol (PROVENTIL HFA;VENTOLIN HFA) 108 (90 Base) MCG/ACT inhaler Inhale 2 puffs into the lungs every 4 (four) hours as needed for wheezing or shortness of breath. 12/22/15   Hayden Rasmussen, NP  amLODipine (NORVASC) 5 MG tablet Take 1 tablet (5 mg total) by mouth daily. 10/17/17   Nahser, Deloris Ping, MD  cetirizine (ZYRTEC) 10 MG tablet Take 1 tablet (10 mg total) by mouth daily. 04/24/15   Barbaraann Barthel, MD  cyclobenzaprine (FLEXERIL) 10  MG tablet Take 1 tablet (10 mg total) by mouth 2 (two) times daily as needed for muscle spasms. 07/23/17   ,  C, PA-C  diclofenac (VOLTAREN) 75 MG EC tablet Take 1 tablet (75 mg total) by mouth 2 (two) times daily. 03/02/17   Mardella Layman, MD  diclofenac sodium (VOLTAREN) 1 % GEL Apply 1 application topically 4 (four) times daily. 12/22/15   Hayden Rasmussen, NP  ibuprofen (ADVIL,MOTRIN) 800 MG tablet Take 1 tablet (800 mg total) by mouth 3 (three) times daily. 07/23/17   ,  C, PA-C  spironolactone (ALDACTONE) 25 MG tablet Take 1 tablet (25 mg total) by mouth daily. 10/17/17 10/12/18  Nahser, Deloris Ping, MD    Family History Family History  Family history unknown: Yes    Social History Social History   Tobacco Use  . Smoking status: Never Smoker  . Smokeless tobacco: Never Used  Substance Use Topics  . Alcohol use: No  . Drug use: No     Allergies   Patient has no known allergies.   Review of Systems Review of Systems  Constitutional: Negative for activity change, appetite change, chills, fatigue and fever.  HENT: Negative for congestion, ear pain, rhinorrhea, sinus pressure, sore throat and trouble swallowing.   Eyes: Negative for discharge and redness.  Respiratory: Negative for cough, chest tightness and shortness  of breath.   Cardiovascular: Negative for chest pain.  Gastrointestinal: Positive for abdominal pain. Negative for diarrhea, nausea and vomiting.  Musculoskeletal: Negative for myalgias.  Skin: Negative for rash.  Neurological: Negative for dizziness, light-headedness and headaches.     Physical Exam Triage Vital Signs ED Triage Vitals  Enc Vitals Group     BP 04/24/18 1326 (!) 159/94     Pulse Rate 04/24/18 1326 80     Resp 04/24/18 1326 18     Temp 04/24/18 1326 98.2 F (36.8 C)     Temp Source 04/24/18 1326 Temporal     SpO2 04/24/18 1326 100 %     Weight --      Height --      Head Circumference --      Peak Flow --      Pain  Score 04/24/18 1323 9     Pain Loc --      Pain Edu? --      Excl. in GC? --    No data found.  Updated Vital Signs BP (!) 159/94 (BP Location: Right Arm) Comment: patient has had blood pressure medicine  Pulse 80   Temp 98.2 F (36.8 C) (Temporal)   Resp 18   SpO2 100%   Visual Acuity Right Eye Distance:   Left Eye Distance:   Bilateral Distance:    Right Eye Near:   Left Eye Near:    Bilateral Near:     Physical Exam Vitals signs and nursing note reviewed.  Constitutional:      General: She is not in acute distress.    Appearance: She is well-developed.  HENT:     Head: Normocephalic and atraumatic.     Mouth/Throat:     Comments: Oral mucosa pink and moist, no tonsillar enlargement or exudate. Posterior pharynx patent and nonerythematous, no uvula deviation or swelling. Normal phonation. Eyes:     Conjunctiva/sclera: Conjunctivae normal.  Neck:     Musculoskeletal: Neck supple.  Cardiovascular:     Rate and Rhythm: Normal rate and regular rhythm.     Heart sounds: No murmur.  Pulmonary:     Effort: Pulmonary effort is normal. No respiratory distress.     Breath sounds: Normal breath sounds.     Comments: Breathing comfortably at rest, CTABL, no wheezing, rales or other adventitious sounds auscultated Abdominal:     Palpations: Abdomen is soft.     Tenderness: There is no abdominal tenderness.     Comments: Nontender to light and deep palpation throughout abdomen, negative rebound, negative McBurney's, negative Murphy's, no focal tenderness  Skin:    General: Skin is warm and dry.  Neurological:     Mental Status: She is alert.      UC Treatments / Results  Labs (all labs ordered are listed, but only abnormal results are displayed) Labs Reviewed - No data to display  EKG None  Radiology No results found.  Procedures Procedures (including critical care time)  Medications Ordered in UC Medications - No data to display  Initial Impression /  Assessment and Plan / UC Course  I have reviewed the triage vital signs and the nursing notes.  Pertinent labs & imaging results that were available during my care of the patient were reviewed by me and considered in my medical decision making (see chart for details).     Patient with mild lower abdominal pain, no tenderness on exam, vital signs stable.  No other associated symptoms.  Diarrhea which  has resolved.  At this time will treat symptomatically and supportively.  Will monitor for continued resolution of abdominal pain follow-up if symptoms are returning or worsening.Discussed strict return precautions. Patient verbalized understanding and is agreeable with plan.  Final Clinical Impressions(s) / UC Diagnoses   Final diagnoses:  Lower abdominal pain     Discharge Instructions     Your symptoms are mostly likely from a a viral stomach bug. Your symptoms should improve over the next few days as your body continues to rid the infectious cause.  For nausea: Start with clear liquids, then move to plain foods like bananas, rice, applesauce, toast, broth, grits, oatmeal. As those food settle okay you may transition to your normal foods. Avoid spicy and greasy foods as much as possible.  For Diarrhea: This is your body's natural way of getting rid of a virus. You may try taking 1 imodium or pepto bismol to decrease amount of stools a day, but we do not want you to stop your diarrhea.   Preventing dehydration is key! You need to replace the fluid your body is expelling. Drink plenty of fluids, may use Pedialyte or sports drinks.   Please return if you are experiencing blood in your vomit or stool or experiencing dizziness, lightheadedness, extreme fatigue, increased abdominal pain, fever.   ED Prescriptions    None     Controlled Substance Prescriptions [place] Controlled Substance Registry consulted? Not Applicable   Lew Dawes, [place] 04/25/18 3299

## 2018-04-27 ENCOUNTER — Ambulatory Visit: Payer: Medicaid Other | Admitting: Cardiovascular Disease

## 2018-04-27 NOTE — Telephone Encounter (Signed)
  Covid acuty level 3 - very stable,  Benign HTN .

## 2018-05-24 ENCOUNTER — Telehealth: Payer: Self-pay

## 2018-05-24 NOTE — Telephone Encounter (Signed)
Left message for pt to call back about getting on the schedule for August.

## 2018-09-25 ENCOUNTER — Other Ambulatory Visit: Payer: Self-pay

## 2018-09-25 ENCOUNTER — Ambulatory Visit (INDEPENDENT_AMBULATORY_CARE_PROVIDER_SITE_OTHER): Payer: Medicare Other | Admitting: Podiatry

## 2018-09-25 ENCOUNTER — Encounter: Payer: Self-pay | Admitting: Podiatry

## 2018-09-25 ENCOUNTER — Ambulatory Visit (INDEPENDENT_AMBULATORY_CARE_PROVIDER_SITE_OTHER): Payer: Medicare Other

## 2018-09-25 DIAGNOSIS — M7662 Achilles tendinitis, left leg: Secondary | ICD-10-CM

## 2018-09-25 DIAGNOSIS — M7752 Other enthesopathy of left foot: Secondary | ICD-10-CM | POA: Diagnosis not present

## 2018-09-25 MED ORDER — MELOXICAM 15 MG PO TABS: 15.0000 mg | ORAL_TABLET | Freq: Every day | ORAL | 1 refills | Status: DC

## 2018-09-25 MED ORDER — METHYLPREDNISOLONE 4 MG PO TBPK: ORAL_TABLET | ORAL | 0 refills | Status: DC

## 2018-09-27 ENCOUNTER — Encounter: Payer: Self-pay | Admitting: Cardiovascular Disease

## 2018-09-27 ENCOUNTER — Ambulatory Visit (INDEPENDENT_AMBULATORY_CARE_PROVIDER_SITE_OTHER): Payer: Medicare Other | Admitting: Cardiovascular Disease

## 2018-09-27 ENCOUNTER — Ambulatory Visit: Payer: Medicaid Other | Admitting: Cardiovascular Disease

## 2018-09-27 ENCOUNTER — Other Ambulatory Visit: Payer: Self-pay

## 2018-09-27 VITALS — BP 136/88 | HR 86 | Ht 60.0 in | Wt 125.4 lb

## 2018-09-27 DIAGNOSIS — I10 Essential (primary) hypertension: Secondary | ICD-10-CM

## 2018-09-27 NOTE — Progress Notes (Signed)
Cardiology Office Note:    Date:  09/27/2018   ID:  Terri Winters, DOB 08-25-1955, MRN [id number]  PCP:  Harlan Stains, MD  Cardiologist:  Mertie Moores, MD   Referring MD: Harlan Stains, MD   Chief Complaint  Patient presents with  . Hypertension      Terri Winters is a 63 y.o. female with a hx of essential hypertension. We asked her to see her today for further evaluation and management of her hypertension by Dr. Dema Severin.   Has had HTN for 7 years ago  Does not eat salt .  Does not eat salty foods.  Eats at home mostly .    Strong family hx of HTN .   Is on both Lisinopril and micardis  No CP or dyspnea Does private duty Psychologist, counselling.   fairy actvie through the day  Med list is incorrect.  She currently is on HCTZ 50 mg a day and losartan 100 mg a day.  Dec. 4, 2019:  Terri Winters is seen back for her hypertension today.  We started her on Aldactone and amlodipine at her last office visit.  Blood pressure is better but  remains mildly elevated. Is watching her salt.   She is recently been seen by her primary medical doctor.  Dr. Dema Severin made some changes but [name] does not know what they are and does not have the new pills.  She did not bring any of her medicine bottles.   Aug. 19, 2020 :  Doing well.  Staying away from salt for the most part.    Past Medical History:  Diagnosis Date  . Asthma   . High cholesterol   . Hypertension   . Syncope     Past Surgical History:  Procedure Laterality Date  . CESAREAN SECTION      Current Medications: Current Meds  Medication Sig  . albuterol (PROVENTIL HFA;VENTOLIN HFA) 108 (90 Base) MCG/ACT inhaler Inhale 2 puffs into the lungs every 4 (four) hours as needed for wheezing or shortness of breath.  Marland Kitchen amLODipine (NORVASC) 5 MG tablet Take 1 tablet (5 mg total) by mouth daily.  . cetirizine (ZYRTEC) 10 MG tablet Take 1 tablet (10 mg total) by mouth daily.  . cyclobenzaprine (FLEXERIL) 10 MG  tablet Take 1 tablet (10 mg total) by mouth 2 (two) times daily as needed for muscle spasms.  . diclofenac (VOLTAREN) 75 MG EC tablet Take 1 tablet (75 mg total) by mouth 2 (two) times daily.  . diclofenac sodium (VOLTAREN) 1 % GEL Apply 1 application topically 4 (four) times daily.  Marland Kitchen ibuprofen (ADVIL,MOTRIN) 800 MG tablet Take 1 tablet (800 mg total) by mouth 3 (three) times daily.  . meloxicam (MOBIC) 15 MG tablet Take 1 tablet (15 mg total) by mouth daily.  Marland Kitchen spironolactone (ALDACTONE) 25 MG tablet Take 1 tablet (25 mg total) by mouth daily.     Allergies:   Patient has no known allergies.   Social History   Socioeconomic History  . Marital status: Divorced    Spouse name: Not on file  . Number of children: Not on file  . Years of education: Not on file  . Highest education level: Not on file  Occupational History  . Not on file  Social Needs  . Financial resource strain: Not on file  . Food insecurity    Worry: Not on file    Inability: Not on file  . Transportation needs    Medical: Not on file  Non-medical: Not on file  Tobacco Use  . Smoking status: Never Smoker  . Smokeless tobacco: Never Used  Substance and Sexual Activity  . Alcohol use: No  . Drug use: No  . Sexual activity: Not on file  Lifestyle  . Physical activity    Days per week: Not on file    Minutes per session: Not on file  . Stress: Not on file  Relationships  . Social Musicianconnections    Talks on phone: Not on file    Gets together: Not on file    Attends religious service: Not on file    Active member of club or organization: Not on file    Attends meetings of clubs or organizations: Not on file    Relationship status: Not on file  Other Topics Concern  . Not on file  Social History Narrative  . Not on file     Family History: The patient's Family history is unknown by patient.  ROS:   Please see the history of present illness.     All other systems reviewed and are negative.   EKGs/Labs/Other Studies Reviewed:    The following studies were reviewed today:   EKG:  Aug. 19, 2020 :  NSR at 2986.   Normal ECG   Recent Labs: 10/31/2017: BUN 14; Creatinine, Ser 0.77; Potassium 4.5; Sodium 139  Recent Lipid Panel No results found for: CHOL, TRIG, HDL, CHOLHDL, VLDL, LDLCALC, LDLDIRECT  Physical Exam:     Physical Exam: Blood pressure 136/88, pulse 86, height 5' (1.524 m), weight 125 lb 6.4 oz (56.9 kg), SpO2 98 %.  GEN:  Well nourished, well developed in no acute distress HEENT: Normal NECK: No JVD; No carotid bruits LYMPHATICS: No lymphadenopathy CARDIAC: RRR , no murmurs, rubs, gallops RESPIRATORY:  Clear to auscultation without rales, wheezing or rhonchi  ABDOMEN: Soft, non-tender, non-distended MUSCULOSKELETAL:  No edema; No deformity  SKIN: Warm and dry NEUROLOGIC:  Alert and oriented x 3    ASSESSMENT:    1. Essential hypertension    PLAN:    In order of problems listed above:  Hypertension:   BP is well controlled.  Continue meds.   We can increase Aldactone further if needed for further BP control . Advised regular exercise,  Salt restriction.    Medication Adjustments/Labs and Tests Ordered: Current medicines are reviewed at length with the patient today.  Concerns regarding medicines are outlined above.  Orders Placed This Encounter  Procedures  . EKG 12-Lead   No orders of the defined types were placed in this encounter.   Patient Instructions  Medication Instructions:  Your physician recommends that you continue on your current medications as directed. Please refer to the Current Medication list given to you today.  If you need a refill on your cardiac medications before your next appointment, please call your pharmacy.   Lab work: None ordered If you have labs (blood work) drawn today and your tests are completely normal, you will receive your results only by: Marland Kitchen. MyChart Message (if you have MyChart) OR . A paper copy in the  mail If you have any lab test that is abnormal or we need to change your treatment, we will call you to review the results.  Testing/Procedures: None ordered  Follow-Up: At [facility], you and your health needs are our priority.  As part of our continuing mission to provide you with exceptional heart care, we have created designated Provider Care Teams.  These Care Teams include  your primary Cardiologist (physician) and Advanced Practice Providers (APPs -  Physician Assistants and Nurse Practitioners) who all work together to provide you with the care you need, when you need it. You will need a follow up appointment in:  6 months.  Please call our office 2 months in advance to schedule this appointment.  You may see one of the following Advanced Practice Providers on your designated Care Team: Tereso NewcomerScott Weaver, PA-C Vin D[place] City[place], [place] . Berton BonJanine Hammond, NP  Any Other Special Instructions Will Be Listed Below (If Applicable).       Signed, Kristeen MissPhilip , MD  09/27/2018 5:36 PM    [facility]

## 2018-09-27 NOTE — Progress Notes (Signed)
   HPI: female 63 y.o. presenting today with a chief complaint of soreness to the posterior left heel that began about one week ago. Walking increases the pain. She has been icing and soaking the foot for treatment with minimal relief. Patient is here for further evaluation and treatment.   Past Medical History:  Diagnosis Date  . Asthma   . High cholesterol   . Hypertension   . Syncope       Physical Exam: General: The patient is alert and oriented x3 in no acute distress.  Dermatology: Skin is warm, dry and supple bilateral lower extremities. Negative for open lesions or macerations.  Vascular: Palpable pedal pulses bilaterally. No edema or erythema noted. Capillary refill within normal limits.  Neurological: Epicritic and protective threshold grossly intact bilaterally.   Musculoskeletal Exam: Pain on palpation noted to the posterior tubercle of the left calcaneus at the insertion of the Achilles tendon consistent with retrocalcaneal bursitis. Pain with palpation noted to the 1st MPJ of the left foot. Range of motion within normal limits. Muscle strength 5/5 in all muscle groups bilateral lower extremities.  Radiographic Exam:  Posterior calcaneal spur noted to the respective calcaneus on lateral view. No fracture or dislocation noted. Normal osseous mineralization noted.     Assessment: 1. Insertional Achilles tendinitis left 2. 1st MPJ capsulitis left    Plan of Care:  1. Patient was evaluated. Radiographs were reviewed today. 2. Declined injections.  3. Prescription for Medrol Dose Pak provided to patient. 4. Prescription for Meloxicam provided to patient. 5. CAM boot dispensed. Weightbearing as tolerated for four weeks.  6. Return to clinic in 4 weeks.   Nursing aid.    Edrick Kins, DPM [facility]  Dr. Edrick Kins, [address]                                        [place], [place] [address]                Office [phone]   Fax [phone]

## 2018-09-27 NOTE — Patient Instructions (Signed)
Medication Instructions:  Your physician recommends that you continue on your current medications as directed. Please refer to the Current Medication list given to you today.  If you need a refill on your cardiac medications before your next appointment, please call your pharmacy.   Lab work: None ordered If you have labs (blood work) drawn today and your tests are completely normal, you will receive your results only by: Marland Kitchen MyChart Message (if you have MyChart) OR . A paper copy in the mail If you have any lab test that is abnormal or we need to change your treatment, we will call you to review the results.  Testing/Procedures: None ordered  Follow-Up: At [facility], you and your health needs are our priority.  As part of our continuing mission to provide you with exceptional heart care, we have created designated Provider Care Teams.  These Care Teams include your primary Cardiologist (physician) and Advanced Practice Providers (APPs -  Physician Assistants and Nurse Practitioners) who all work together to provide you with the care you need, when you need it. You will need a follow up appointment in:  6 months.  Please call our office 2 months in advance to schedule this appointment.  You may see one of the following Advanced Practice Providers on your designated Care Team: Richardson Dopp, PA-C Vin [place], [place] . Daune Perch, NP  Any Other Special Instructions Will Be Listed Below (If Applicable).

## 2018-10-25 ENCOUNTER — Other Ambulatory Visit: Payer: Self-pay

## 2018-10-25 ENCOUNTER — Ambulatory Visit (INDEPENDENT_AMBULATORY_CARE_PROVIDER_SITE_OTHER): Payer: Medicare Other | Admitting: Podiatry

## 2018-10-25 DIAGNOSIS — M7752 Other enthesopathy of left foot: Secondary | ICD-10-CM | POA: Diagnosis not present

## 2018-10-25 DIAGNOSIS — M7662 Achilles tendinitis, left leg: Secondary | ICD-10-CM | POA: Diagnosis not present

## 2018-10-28 NOTE — Progress Notes (Signed)
   HPI: 63 y.o. female presenting today for follow up evaluation of insertional Achilles tendinitis of the left lower extremity. She denies any pain or modifying factors. She states the heel has healed well. She has been taking the Meloxicam and using the CAM boot as directed. Patient is here for further evaluation and treatment.   Past Medical History:  Diagnosis Date  . Asthma   . High cholesterol   . Hypertension   . Syncope       Physical Exam: General: The patient is alert and oriented x3 in no acute distress.  Dermatology: Skin is warm, dry and supple bilateral lower extremities. Negative for open lesions or macerations.  Vascular: Palpable pedal pulses bilaterally. No edema or erythema noted. Capillary refill within normal limits.  Neurological: Epicritic and protective threshold grossly intact bilaterally.   Musculoskeletal Exam: Pain on palpation noted to the posterior tubercle of the left calcaneus at the insertion of the Achilles tendon consistent with retrocalcaneal bursitis. Pain with palpation noted to the 1st MPJ of the left foot. Range of motion within normal limits. Muscle strength 5/5 in all muscle groups bilateral lower extremities.  Assessment: 1. Insertional Achilles tendinitis left 2. 1st MPJ capsulitis left    Plan of Care:  1. Patient was evaluated.  2. Continue taking Meloxicam as needed.  3. Discontinue using CAM boot.  4. Return to clinic as needed.   Nursing aid.    Edrick Kins, DPM [facility]  Dr. Edrick Kins, [address]                                        [place], [place] [address]                Office [phone]  Fax [phone]

## 2018-12-30 ENCOUNTER — Other Ambulatory Visit: Payer: Self-pay

## 2018-12-30 DIAGNOSIS — Z20822 Contact with and (suspected) exposure to covid-19: Secondary | ICD-10-CM

## 2019-01-01 LAB — NOVEL CORONAVIRUS, NAA: SARS-CoV-2, NAA: NOT DETECTED

## 2019-01-22 ENCOUNTER — Ambulatory Visit (HOSPITAL_COMMUNITY)
Admission: EM | Admit: 2019-01-22 | Discharge: 2019-01-22 | Disposition: A | Discharge status: Home / Self Care | Payer: Medicare Other | Attending: Emergency Medicine | Admitting: Emergency Medicine

## 2019-01-22 ENCOUNTER — Encounter (HOSPITAL_COMMUNITY): Payer: Self-pay

## 2019-01-22 ENCOUNTER — Other Ambulatory Visit: Payer: Self-pay

## 2019-01-22 DIAGNOSIS — R42 Dizziness and giddiness: Secondary | ICD-10-CM | POA: Diagnosis not present

## 2019-01-22 NOTE — Discharge Instructions (Addendum)
Continue pushing plenty of electrolyte containing fluids such as Pedialyte, Gatorade.  Eat regular small meals.  Buy blood pressure cuff and keep a log of your blood pressure.  Measure your blood pressure once a day, preferably at the same time every day.  If your blood pressure consistently remains above 140/90, you may need your medications changed.  Decrease your salt intake. diet and exercise will lower your blood pressure significantly. It is important to keep your blood pressure under good control, as having a elevated blood pressure for prolonged periods of time significantly increases your risk of stroke, heart attacks, kidney damage, eye damage, and other problems.  Return immediately to the ER if you start having chest pain, headache, problems seeing, problems talking, problems walking, if you feel like you're about to pass out, if you do pass out, if you have a seizure, or for any other concerns.  Go to www.goodrx.com to look up your medications. This will give you a list of where you can find your prescriptions at the most affordable prices. Or ask the pharmacist what the cash price is, or if they have any other discount programs available to help make your medication more affordable. This can be less expensive than what you would pay with insurance.

## 2019-01-22 NOTE — ED Triage Notes (Signed)
Pt present dizziness and elevated blood pressure. Symptoms started yesterday.

## 2019-01-22 NOTE — ED Provider Notes (Signed)
HPI  SUBJECTIVE:  Terri Winters is a 63 y.o. female who presents with intermittent, episodes of dizziness described as lightheadedness and feeling" off balance" starting yesterday.  She states that it lasts a second or 2 and then resolves.  It happens primarily when she goes from sitting to standing rapidly or when she bends over and sits up quickly.  She tried Tylenol, pushing fluids.  She states that pushing fluids has helped significantly.  There is no exertional component to it.  She denies chest pain, shortness of breath, palpitations, presyncope, syncope.  No seizures.  She reports some mild headache, but states that it is nothing new or different than headaches that she has had before when she has not eaten well.  She denies tinnitus, ear pain.  No nausea, vomiting, diarrhea.  No arm or leg weakness, slurred speech, facial droop.  No numbness or tingling in arms or legs.  No abdominal pain, anuria, hematuria, lower extremity edema.  She is concerned that her blood pressure is high.  She does not have a blood pressure cuff at home.  She is not sure what it has been running recently.  She has a past medical history of hypertension states that she is compliant with her medications which include amlodipine and spironolactone.  No change in her medications recently.  She states that she has had episodes of dizziness before, usually when she does not eat well and when her blood pressure is elevated.  She says last time that this happened, they adjusted her blood pressure medications. She has a history of asthma.  No history of diabetes, vertigo, arrhythmia, atrial fibrillation, MI, stroke.  PMD: [facility].   Past Medical History:  Diagnosis Date  . Asthma   . High cholesterol   . Hypertension   . Syncope     Past Surgical History:  Procedure Laterality Date  . CESAREAN SECTION      Family History  Family history unknown: Yes    Social History   Tobacco Use  .  Smoking status: Never Smoker  . Smokeless tobacco: Never Used  Substance Use Topics  . Alcohol use: No  . Drug use: No    No current facility-administered medications for this encounter.  Current Outpatient Medications:  .  albuterol (PROVENTIL HFA;VENTOLIN HFA) 108 (90 Base) MCG/ACT inhaler, Inhale 2 puffs into the lungs every 4 (four) hours as needed for wheezing or shortness of breath., Disp: 1 Inhaler, Rfl: 0 .  amLODipine (NORVASC) 10 MG tablet, Take 10 mg by mouth daily., Disp: , Rfl:  .  amLODipine (NORVASC) 5 MG tablet, Take 1 tablet (5 mg total) by mouth daily., Disp: 90 tablet, Rfl: 3 .  cetirizine (ZYRTEC) 10 MG tablet, Take 1 tablet (10 mg total) by mouth daily., Disp: 30 tablet, Rfl: 0 .  diclofenac sodium (VOLTAREN) 1 % GEL, Apply 1 application topically 4 (four) times daily., Disp: 100 g, Rfl: 0 .  montelukast (SINGULAIR) 10 MG tablet, Take 10 mg by mouth every evening., Disp: , Rfl:  .  spironolactone (ALDACTONE) 25 MG tablet, Take 1 tablet (25 mg total) by mouth daily., Disp: 90 tablet, Rfl: 3  No Known Allergies   ROS  As noted in HPI.   Physical Exam  BP (!) 162/90 (BP Location: Right Arm)   Pulse 85   Temp 98.6 F (37 C) (Oral)   Resp 16   SpO2 100%   Constitutional: Well developed, well nourished, no acute distress Eyes:  EOMI,  conjunctiva normal bilaterally PERRLA no photophobia HENT: Normocephalic, atraumatic,mucus membranes moist Respiratory: Normal inspiratory effort, lungs clear bilaterally  cardiovascular: Normal rate, rhythm no murmurs rubs or gallops.  No carotid bruit GI: nondistended skin: No rash, skin intact Musculoskeletal: no deformities Neurologic: Alert & oriented x 3, cranial nerves III through XII intact as tested, finger-nose, heel shin within normal limits.  Tandem gait steady.  Romberg negative.  Dix-Hallpike negative bilaterally. Psychiatric: Speech and behavior appropriate   ED Course   Medications - No data to display  No  orders of the defined types were placed in this encounter.   No results found for this or any previous visit (from the past 24 hour(s)). No results found.  ED Clinical Impression  1. Lightheadedness      ED Assessment/Plan  Patient states that she has gotten better after pushing fluids.  States that she was not eating or drinking well prior to symptoms starting.  Suspect mild dehydration.  Doubt central or cardiac cause of her symptoms.  She is to continue pushing fluids, eat regular small meals.  Advised her to get a blood pressure cuff and measure her blood pressure once a day.  She will follow-up with her primary care physician in 1 week if her blood pressure stays consistently above 140/90 for medication adjustment.  Discussed with her that I did not want to change her blood pressure medication based on one data point today.  No evidence of a hypertensive emergency.  Her blood pressure today is close to what it was last on her last visit, when it was 159/94 on 3/20.  She denies headache, visual changes, focal neurologic deficits, chest pain, shortness of breath, palpitations, seizures, syncope, abdominal pain, anuria, hematuria, lower extremity edema.    Discussed MDM, treatment plan, and plan for follow-up with patient. Discussed sn/sx that should prompt return to the ED. patient agrees with plan.   No orders of the defined types were placed in this encounter.   *This clinic note was created using Dragon dictation software. Therefore, there may be occasional mistakes despite careful proofreading.   ?   Domenick Gong, MD 01/22/19 2103

## 2019-03-17 ENCOUNTER — Other Ambulatory Visit: Payer: Self-pay

## 2019-03-17 ENCOUNTER — Encounter (HOSPITAL_COMMUNITY): Payer: Self-pay | Admitting: *Deleted

## 2019-03-17 ENCOUNTER — Ambulatory Visit (HOSPITAL_COMMUNITY)
Admission: EM | Admit: 2019-03-17 | Discharge: 2019-03-17 | Disposition: A | Discharge status: Home / Self Care | Payer: Medicare Other

## 2019-03-17 DIAGNOSIS — R0989 Other specified symptoms and signs involving the circulatory and respiratory systems: Secondary | ICD-10-CM | POA: Diagnosis not present

## 2019-03-17 DIAGNOSIS — I10 Essential (primary) hypertension: Secondary | ICD-10-CM

## 2019-03-17 MED ORDER — SUCRALFATE 1 GM/10ML PO SUSP: 1.0000 g | Freq: Three times a day (TID) | ORAL | 0 refills | Status: DC

## 2019-03-17 NOTE — ED Triage Notes (Signed)
Pt reports eating chicken pot pie and some peaches w/ cream yesterday for lunch; shortly thereafter noticed sensation of foreign body in throat.  States has drank warm tea and PO fluids without any difficulty, but sensation remains, though has improved slightly.  Denies any chest pain.  States has not attempted to eat any food since onset of sx.

## 2019-03-17 NOTE — Discharge Instructions (Addendum)
Please follow-up with your PCP if symptoms have not improved within 48 hours. You may require gastroenterology evaluation of your symptoms with diagnostic imaging if no improvement.  Blood pressure is elevated today. Keep follow-up with cardiologist.

## 2019-03-17 NOTE — ED Provider Notes (Signed)
[facility]    CSN: [id number] Arrival date & time: 03/17/19  1054      History   Chief Complaint Chief Complaint  Patient presents with  . Foreign Body    HPI Terri Winters is a 64 y.o. female.    HPI  Patient presents for evaluation of sensation of FB in throat. She reports eating a meal boneless chicken with vegetables and peaches. She ate rather quicker than normal. Reports approximately 1 hour after eating feeling the sensation that something was stuck in her throat. She denies ingesting anything hard with her meal. She is able to drink and eat today without dysphagia symptoms. She was able to swallow morning medication without issues. She only experiences the sensation I her throat with swallowing. The sensation is a fullness, not painful. Denies any cough, history GERD, or abdominal pain. Past Medical History:  Diagnosis Date  . Asthma   . High cholesterol   . Hypertension   . Syncope     Patient Active Problem List   Diagnosis Date Noted  . HTN (hypertension) 09/27/2018    Past Surgical History:  Procedure Laterality Date  . CESAREAN SECTION      OB History   No obstetric history on file.      Home Medications    Prior to Admission medications   Medication Sig Start Date End Date Taking? Authorizing Provider  albuterol (PROVENTIL HFA;VENTOLIN HFA) 108 (90 Base) MCG/ACT inhaler Inhale 2 puffs into the lungs every 4 (four) hours as needed for wheezing or shortness of breath. 12/22/15  Yes Mabe, Onalee Hua, NP  amLODipine (NORVASC) 10 MG tablet Take 10 mg by mouth daily. 09/22/18  Yes [provider]  cetirizine (ZYRTEC) 10 MG tablet Take 1 tablet (10 mg total) by mouth daily. 04/24/15  Yes Barbaraann Barthel, MD  hydrochlorothiazide (MICROZIDE) 12.5 MG capsule Take 12.5 mg by mouth daily.   Yes [provider]  montelukast (SINGULAIR) 10 MG tablet Take 10 mg by mouth every evening. 10/15/18  Yes [provider]  UNKNOWN  TO PATIENT Cholesterol med   Yes [provider]  amLODipine (NORVASC) 5 MG tablet Take 1 tablet (5 mg total) by mouth daily. 10/17/17   Nahser, Deloris Ping, MD  diclofenac sodium (VOLTAREN) 1 % GEL Apply 1 application topically 4 (four) times daily. 12/22/15   Hayden Rasmussen, NP  spironolactone (ALDACTONE) 25 MG tablet Take 1 tablet (25 mg total) by mouth daily. 10/17/17 10/12/18  Nahser, Deloris Ping, MD    Family History Family History  Problem Relation Age of Onset  . Diabetes Mother     Social History Social History   Tobacco Use  . Smoking status: Never Smoker  . Smokeless tobacco: Never Used  Substance Use Topics  . Alcohol use: No  . Drug use: No     Allergies   Patient has no known allergies.   Review of Systems Review of Systems  Pertinent negatives listed in HPI Physical Exam Triage Vital Signs ED Triage Vitals  Enc Vitals Group     BP 03/17/19 1120 (!) 165/95     Pulse Rate 03/17/19 1120 94     Resp 03/17/19 1120 16     Temp 03/17/19 1120 98.2 F (36.8 C)     Temp Source 03/17/19 1120 Oral     SpO2 03/17/19 1120 98 %     Weight --      Height --      Head Circumference --  Peak Flow --      Pain Score 03/17/19 1122 2     Pain Loc --      Pain Edu? --      Excl. in GC? --    No data found.  Updated Vital Signs BP (!) 165/95   Pulse 94   Temp 98.2 F (36.8 C) (Oral)   Resp 16   SpO2 98%   Visual Acuity Right Eye Distance:   Left Eye Distance:   Bilateral Distance:    Right Eye Near:   Left Eye Near:    Bilateral Near:     Physical Exam Constitutional:      Appearance: Normal appearance.  HENT:     Mouth/Throat:     Mouth: Mucous membranes are moist.     Pharynx: Oropharynx is clear. No pharyngeal swelling, oropharyngeal exudate or uvula swelling.  Neck:     Thyroid: No thyromegaly.     Trachea: Trachea normal. No abnormal tracheal secretions.  Cardiovascular:     Rate and Rhythm: Normal rate and regular rhythm.  Pulmonary:      Effort: Pulmonary effort is normal.     Breath sounds: Normal breath sounds.  Musculoskeletal:     Cervical back: Normal range of motion. No rigidity. No spinous process tenderness or muscular tenderness. Normal range of motion.  Lymphadenopathy:     Cervical: No cervical adenopathy.  Neurological:     General: No focal deficit present.     Mental Status: She is alert.  Psychiatric:        Mood and Affect: Mood normal.      UC Treatments / Results  Labs (all labs ordered are listed, but only abnormal results are displayed) Labs Reviewed - No data to display  EKG   Radiology No results found.  Procedures Procedures (including critical care time)  Medications Ordered in UC Medications - No data to display  Initial Impression / Assessment and Plan / UC Course  I have reviewed the triage vital signs and the nursing notes.  Pertinent labs & imaging results that were available during my care of the patient were reviewed by me and considered in my medical decision making (see chart for details).     Sensation of foreign body in throat -Exam of neck and throat grossly intact. Suspect esogastritis related to ingesting meal very quickly. Discussed also, symptoms of throat fullness, could be an early symptom of GERD. Monitor intake of types of foods that cause sensation of FB in throat. Patient is able to swallow and tolerate food without dysphagia symptoms.  -Trial Carafate PRN with meals to relieve symptoms of FB sensation in throat. Continue food and drink intake as normal.   Accelerated hypertension -Currently prescribed antihypertensive therapy -Keep scheduled follow-up in 1 week with cardiologist. -Limit sodium and monitor BP at home. -If BP >180/100 or CP or SOB occur, go immediately to the ER.     Final Clinical Impressions(s) / UC Diagnoses   Final diagnoses:  Sensation of foreign body in throat  Accelerated hypertension     Discharge Instructions       Please follow-up with your PCP if symptoms have not improved within 48 hours. You may require gastroenterology evaluation of your symptoms with diagnostic imaging if no improvement.  Blood pressure is elevated today. Keep follow-up with cardiologist.    ED Prescriptions    Medication Sig Dispense Auth. Provider   sucralfate (CARAFATE) 1 GM/10ML suspension Take 10 mLs (1 g total) by mouth  4 (four) times daily -  with meals and at bedtime for 7 days. 280 mL Scot Jun, FNP     PDMP not reviewed this encounter.   Scot Jun, [place] 03/19/19 [phone]

## 2019-03-21 ENCOUNTER — Encounter: Payer: Self-pay | Admitting: Cardiology

## 2019-03-27 ENCOUNTER — Ambulatory Visit: Payer: Medicare Other | Admitting: Cardiology

## 2019-04-02 ENCOUNTER — Ambulatory Visit: Payer: Medicare Other | Admitting: Cardiovascular Disease

## 2019-05-02 ENCOUNTER — Other Ambulatory Visit: Payer: Self-pay

## 2019-05-02 ENCOUNTER — Encounter: Payer: Self-pay | Admitting: Cardiovascular Disease

## 2019-05-02 ENCOUNTER — Ambulatory Visit (INDEPENDENT_AMBULATORY_CARE_PROVIDER_SITE_OTHER): Payer: Medicare Other | Admitting: Cardiovascular Disease

## 2019-05-02 VITALS — BP 132/74 | HR 88 | Ht 60.0 in | Wt 129.2 lb

## 2019-05-02 DIAGNOSIS — I10 Essential (primary) hypertension: Secondary | ICD-10-CM

## 2019-05-02 NOTE — Patient Instructions (Signed)
Medication Instructions:  No changes *If you need a refill on your cardiac medications before your next appointment, please call your pharmacy*   Lab Work: none If you have labs (blood work) drawn today and your tests are completely normal, you will receive your results only by: Marland Kitchen MyChart Message (if you have MyChart) OR . A paper copy in the mail If you have any lab test that is abnormal or we need to change your treatment, we will call you to review the results.   Testing/Procedures: none   Follow-Up: At [facility], you and your health needs are our priority.  As part of our continuing mission to provide you with exceptional heart care, we have created designated Provider Care Teams.  These Care Teams include your primary Cardiologist (physician) and Advanced Practice Providers (APPs -  Physician Assistants and Nurse Practitioners) who all work together to provide you with the care you need, when you need it.  We recommend signing up for the patient portal called "MyChart".  Sign up information is provided on this After Visit Summary.  MyChart is used to connect with patients for Virtual Visits (Telemedicine).  Patients are able to view lab/test results, encounter notes, upcoming appointments, etc.  Non-urgent messages can be sent to your provider as well.   To learn more about what you can do with MyChart, go to [email or web address].    Your next appointment:   12 month(s)  The format for your next appointment:   In Person  Provider:   You may see one of the following Advanced Practice Providers on your designated Care Team:    [name], PA-C  Vin Wayne, [place]  Berton Bon, NP    Other Instructions

## 2019-05-02 NOTE — Progress Notes (Signed)
Cardiology Office Note:    Date:  05/02/2019   ID:  Zelena, [place] [birth year], MRN [id number]  PCP:  System, Provider Not In  Cardiologist:  Kristeen Miss, MD   Referring MD: No ref. provider found   Chief Complaint  Patient presents with  . Hypertension      Terri Winters is a 64 y.o. female with a hx of essential hypertension. We asked her to see her today for further evaluation and management of her hypertension by Dr. Cliffton Asters.   Has had HTN for 7 years ago  Does not eat salt .  Does not eat salty foods.  Eats at home mostly .    Strong family hx of HTN .   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  She currently is on HCTZ 50 mg a day and losartan 100 mg a day.  Dec. 4, 2019:  Raziya is seen back for her hypertension today.  We started her on Aldactone and amlodipine at her last office visit.  Blood pressure is better but  remains mildly elevated. Is watching her salt.   She is recently been seen by her primary medical doctor.  Dr. Cliffton Asters made some changes but Marchel does not know what they are and does not have the new pills.  She did not bring any of her medicine bottles.   Aug. 19, 2020 :  Doing well.  Staying away from salt for the most part.   May 02, 2019: Terri Winters is seen today for follow-up of her hypertension. Feels well.   Eating a low salt diet . Has been exercising some .  Does a fair amount of walking   Past Medical History:  Diagnosis Date  . Asthma   . High cholesterol   . Hypertension   . Syncope     Past Surgical History:  Procedure Laterality Date  . CESAREAN SECTION      Current Medications: Current Meds  Medication Sig  . albuterol (PROVENTIL HFA;VENTOLIN HFA) 108 (90 Base) MCG/ACT inhaler Inhale 2 puffs into the lungs every 4 (four) hours as needed for wheezing or shortness of breath.  Marland Kitchen amLODipine  (NORVASC) 10 MG tablet Take 10 mg by mouth daily.  . cetirizine (ZYRTEC) 10 MG tablet Take 1 tablet (10 mg total) by mouth daily.  . diclofenac sodium (VOLTAREN) 1 % GEL Apply 1 application topically 4 (four) times daily.  . hydrochlorothiazide (MICROZIDE) 12.5 MG capsule Take 12.5 mg by mouth daily.  . montelukast (SINGULAIR) 10 MG tablet Take 10 mg by mouth every evening.  Marland Kitchen spironolactone (ALDACTONE) 25 MG tablet Take 1 tablet (25 mg total) by mouth daily.  . sucralfate (CARAFATE) 1 GM/10ML suspension Take 10 mLs (1 g total) by mouth 4 (four) times daily -  with meals and at bedtime for 7 days.     Allergies:   Patient has no known allergies.   Social History   Socioeconomic History  . Marital status: Divorced    Spouse name: Not on file  . Number of children: Not on file  . Years of education: Not on file  . Highest education level: Not on file  Occupational History  . Not on file  Tobacco Use  . Smoking status: Never Smoker  . Smokeless tobacco: Never Used  Substance and Sexual Activity  . Alcohol use: No  . Drug use:  No  . Sexual activity: Not on file  Other Topics Concern  . Not on file  Social History Narrative  . Not on file   Social Determinants of Health   Financial Resource Strain:   . Difficulty of Paying Living Expenses:   Food Insecurity:   . Worried About Programme researcher, broadcasting/film/video in the Last Year:   . Barista in the Last Year:   Transportation Needs:   . Freight forwarder (Medical):   Marland Kitchen Lack of Transportation (Non-Medical):   Physical Activity:   . Days of Exercise per Week:   . Minutes of Exercise per Session:   Stress:   . Feeling of Stress :   Social Connections:   . Frequency of Communication with Friends and Family:   . Frequency of Social Gatherings with Friends and Family:   . Attends Religious Services:   . Active Member of Clubs or Organizations:   . Attends Banker Meetings:   Marland Kitchen Marital Status:      Family  History: The patient's family history includes Diabetes in her mother.  ROS:   Please see the history of present illness.     All other systems reviewed and are negative.  EKGs/Labs/Other Studies Reviewed:    The following studies were reviewed today:   EKG:     Recent Labs: No results found for requested labs within last 8760 hours.  Recent Lipid Panel No results found for: CHOL, TRIG, HDL, CHOLHDL, VLDL, LDLCALC, LDLDIRECT  Physical Exam:     Physical Exam: Blood pressure 132/74, pulse 88, height 5' (1.524 m), weight 129 lb 3.2 oz (58.6 kg), SpO2 98 %.  GEN:  Well nourished, well developed in no acute distress HEENT: Normal NECK: No JVD; No carotid bruits LYMPHATICS: No lymphadenopathy CARDIAC: RRR , no murmurs, rubs, gallops RESPIRATORY:  Clear to auscultation without rales, wheezing or rhonchi  ABDOMEN: Soft, non-tender, non-distended MUSCULOSKELETAL:  No edema; No deformity  SKIN: Warm and dry NEUROLOGIC:  Alert and oriented x 3     ASSESSMENT:    No diagnosis found. PLAN:    In order of problems listed above:  Hypertension:    BP is well controlled.  Cont current meds.   We will see her again in 1 year.   Medication Adjustments/Labs and Tests Ordered: Current medicines are reviewed at length with the patient today.  Concerns regarding medicines are outlined above.  No orders of the defined types were placed in this encounter.  No orders of the defined types were placed in this encounter.   Patient Instructions  Medication Instructions:  No changes *If you need a refill on your cardiac medications before your next appointment, please call your pharmacy*   Lab Work: none If you have labs (blood work) drawn today and your tests are completely normal, you will receive your results only by: Marland Kitchen MyChart Message (if you have MyChart) OR . A paper copy in the mail If you have any lab test that is abnormal or we need to change your treatment, we will  call you to review the results.   Testing/Procedures: none   Follow-Up: At [facility], you and your health needs are our priority.  As part of our continuing mission to provide you with exceptional heart care, we have created designated Provider Care Teams.  These Care Teams include your primary Cardiologist (physician) and Advanced Practice Providers (APPs -  Physician Assistants and Nurse Practitioners) who all work together to  provide you with the care you need, when you need it.  We recommend signing up for the patient portal called "MyChart".  Sign up information is provided on this After Visit Summary.  MyChart is used to connect with patients for Virtual Visits (Telemedicine).  Patients are able to view lab/test results, encounter notes, upcoming appointments, etc.  Non-urgent messages can be sent to your provider as well.   To learn more about what you can do with MyChart, go to [email or web address].    Your next appointment:   12 month(s)  The format for your next appointment:   In Person  Provider:   You may see one of the following Advanced Practice Providers on your designated Care Team:    Richardson Dopp, PA-C  Vin Harmony, [place]  Daune Perch, NP    Other Instructions      Signed, Mertie Moores, MD  05/02/2019 5:14 PM    [place]

## 2019-07-14 ENCOUNTER — Ambulatory Visit (HOSPITAL_COMMUNITY)
Admission: EM | Admit: 2019-07-14 | Discharge: 2019-07-14 | Disposition: A | Discharge status: Home / Self Care | Payer: Medicare Other | Attending: Family Medicine | Admitting: Family Medicine

## 2019-07-14 ENCOUNTER — Encounter (HOSPITAL_COMMUNITY): Payer: Self-pay

## 2019-07-14 ENCOUNTER — Other Ambulatory Visit: Payer: Self-pay

## 2019-07-14 DIAGNOSIS — I10 Essential (primary) hypertension: Secondary | ICD-10-CM

## 2019-07-14 DIAGNOSIS — K0889 Other specified disorders of teeth and supporting structures: Secondary | ICD-10-CM

## 2019-07-14 MED ORDER — IBUPROFEN 600 MG PO TABS: 600.0000 mg | ORAL_TABLET | Freq: Three times a day (TID) | ORAL | 0 refills | Status: DC

## 2019-07-14 MED ORDER — PENICILLIN V POTASSIUM 500 MG PO TABS: 500.0000 mg | ORAL_TABLET | Freq: Three times a day (TID) | ORAL | 0 refills | Status: DC

## 2019-07-14 NOTE — Discharge Instructions (Addendum)
Your blood pressure was noted to be elevated during your visit today. If you are currently taking medication for high blood pressure, please ensure you are taking this as directed. If you do not have a history of high blood pressure and your blood pressure remains persistently elevated, you may need to begin taking a medication at some point. You may return here within the next few days to recheck if unable to see your primary care provider or if do not have a one.  BP (!) 157/83   Pulse 86   Temp 98.4 F (36.9 C) (Oral)   Resp 16   Ht 5\' 2"  (1.575 m)   Wt 54.4 kg   SpO2 100%   BMI 21.95 kg/m

## 2019-07-14 NOTE — ED Triage Notes (Signed)
Pt c/o 2/10 intermittent sharp pain in left upper frontal toothx1 wk. Pt denies trouble chewing.

## 2019-07-16 NOTE — ED Provider Notes (Signed)
[facility]   [id number] 07/14/19 Arrival Time: 1700  ASSESSMENT & PLAN:  1. Pain, dental   2. Elevated blood pressure reading in office with diagnosis of hypertension     No sign of abscess requiring I&D at this time. Discussed.  Meds ordered this encounter  Medications   ibuprofen (ADVIL) 600 MG tablet    Sig: Take 1 tablet (600 mg total) by mouth 3 (three) times daily with meals.    Dispense:  21 tablet    Refill:  0   penicillin v potassium (VEETID) 500 MG tablet    Sig: Take 1 tablet (500 mg total) by mouth 3 (three) times daily.    Dispense:  30 tablet    Refill:  0      Discharge Instructions     Your blood pressure was noted to be elevated during your visit today. If you are currently taking medication for high blood pressure, please ensure you are taking this as directed. If you do not have a history of high blood pressure and your blood pressure remains persistently elevated, you may need to begin taking a medication at some point. You may return here within the next few days to recheck if unable to see your primary care provider or if do not have a one.  BP (!) 157/83    Pulse 86    Temp 98.4 F (36.9 C) (Oral)    Resp 16    Ht 5\' 2"  (1.575 m)    Wt 54.4 kg    SpO2 100%    BMI 21.95 kg/m      Has dental visit planned.  Reviewed expectations re: course of current medical issues. Questions answered. Outlined signs and symptoms indicating need for more acute intervention. Patient verbalized understanding. After Visit Summary given.   SUBJECTIVE:  Terri Winters is a 64 y.o. female who reports gradual onset of left upper frontal dental pain described as aching. Present for several days. Fever: absent. Tolerating PO intake but reports pain with chewing. Normal swallowing. She does not see a dentist regularly. No neck swelling or pain. OTC analgesics without relief.  Increased blood pressure noted today. Reports that she is treated for  HTN.  She reports taking medications as instructed, no chest pain on exertion, no dyspnea on exertion, no swelling of ankles, no orthostatic dizziness or lightheadedness, no orthopnea or paroxysmal nocturnal dyspnea and no palpitations.   OBJECTIVE: Vitals:   07/14/19 1732 07/14/19 1734  BP: (!) 157/83   Pulse: 86   Resp: 16   Temp: 98.4 F (36.9 C)   TempSrc: Oral   SpO2: 100%   Weight:  54.4 kg  Height:  5\' 2"  (1.575 m)    General appearance: alert; no distress HENT: normocephalic; atraumatic; dentition: fair; left frontal upper gums without areas of fluctuance, drainage, or bleeding and with tenderness to palpation; normal jaw movement without difficulty Neck: supple without LAD; FROM; trachea midline Lungs: normal respirations; unlabored; speaks full sentences without difficulty Skin: warm and dry Psychological: alert and cooperative; normal mood and affect  No Known Allergies  Past Medical History:  Diagnosis Date   Asthma    High cholesterol    Hypertension    Syncope    Social History   Socioeconomic History   Marital status: Divorced    Spouse name: Not on file   Number of children: Not on file   Years of education: Not on file   Highest education level: Not on file  Occupational History   Not on file  Tobacco Use   Smoking status: Never Smoker   Smokeless tobacco: Never Used  Substance and Sexual Activity   Alcohol use: No   Drug use: No   Sexual activity: Not on file  Other Topics Concern   Not on file  Social History Narrative   Not on file   Social Determinants of Health   Financial Resource Strain:    Difficulty of Paying Living Expenses:   Food Insecurity:    Worried About Charity fundraiser in the Last Year:    Arboriculturist in the Last Year:   Transportation Needs:    Film/video editor (Medical):    Lack of Transportation (Non-Medical):   Physical Activity:    Days of Exercise per Week:    Minutes of  Exercise per Session:   Stress:    Feeling of Stress :   Social Connections:    Frequency of Communication with Friends and Family:    Frequency of Social Gatherings with Friends and Family:    Attends Religious Services:    Active Member of Clubs or Organizations:    Attends Archivist Meetings:    Marital Status:   Intimate Partner Violence:    Fear of Current or Ex-Partner:    Emotionally Abused:    Physically Abused:    Sexually Abused:    Family History  Problem Relation Age of Onset   Diabetes Mother    Past Surgical History:  Procedure Laterality Date   CESAREAN SECTION       Vanessa Kick, MD 07/16/19 [phone]

## 2019-11-30 IMAGING — DX DG FOOT COMPLETE 3+V*R*
3 series · 3 of 3 positions shown · non-contrast
Comparison: None in PACs

CLINICAL DATA: Foot pain for the past 2 weeks which spontaneously
began when the patient got up from a table. The patient is unable to
bear full weight on the foot. The pain is medial and there is
swelling and erythema anterior to the medial malleolus. No history
of gout or previous foot injury.

EXAM:
RIGHT FOOT COMPLETE - 3+ VIEW

[foot ap]
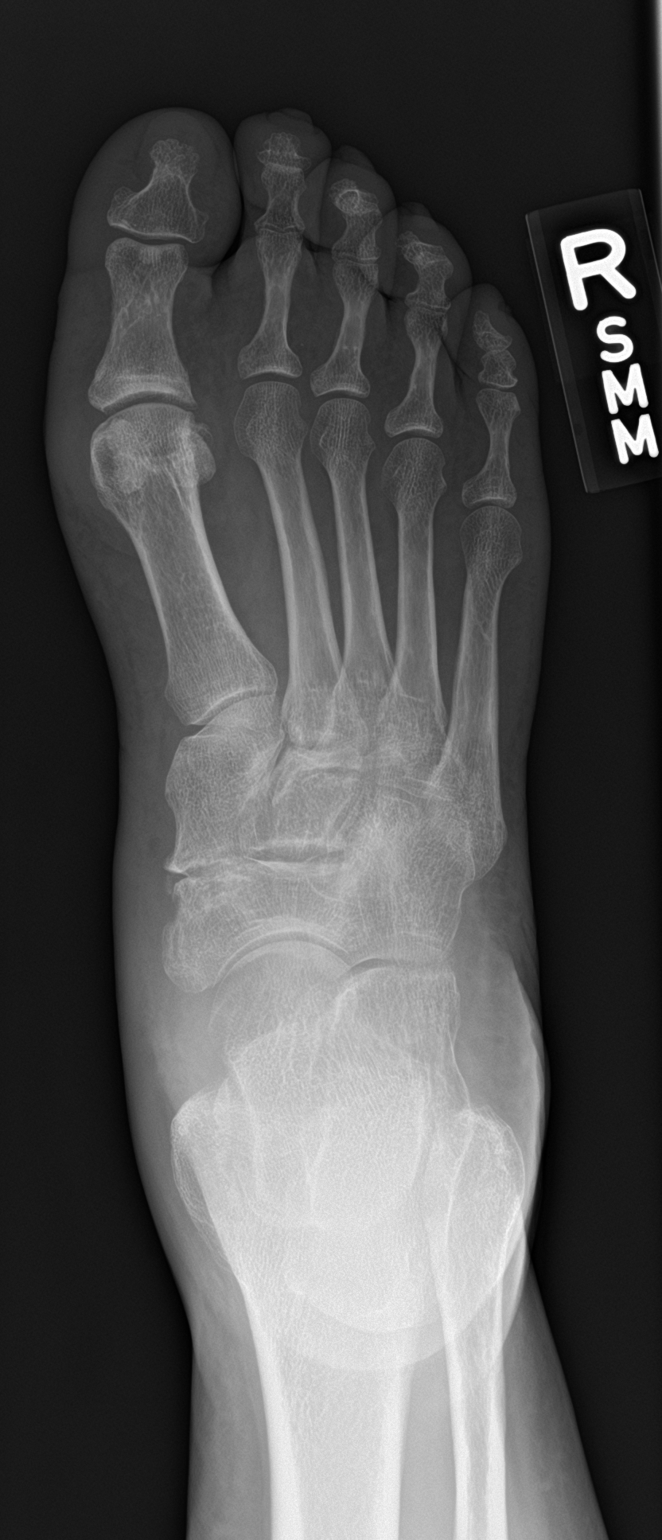

[foot obl]
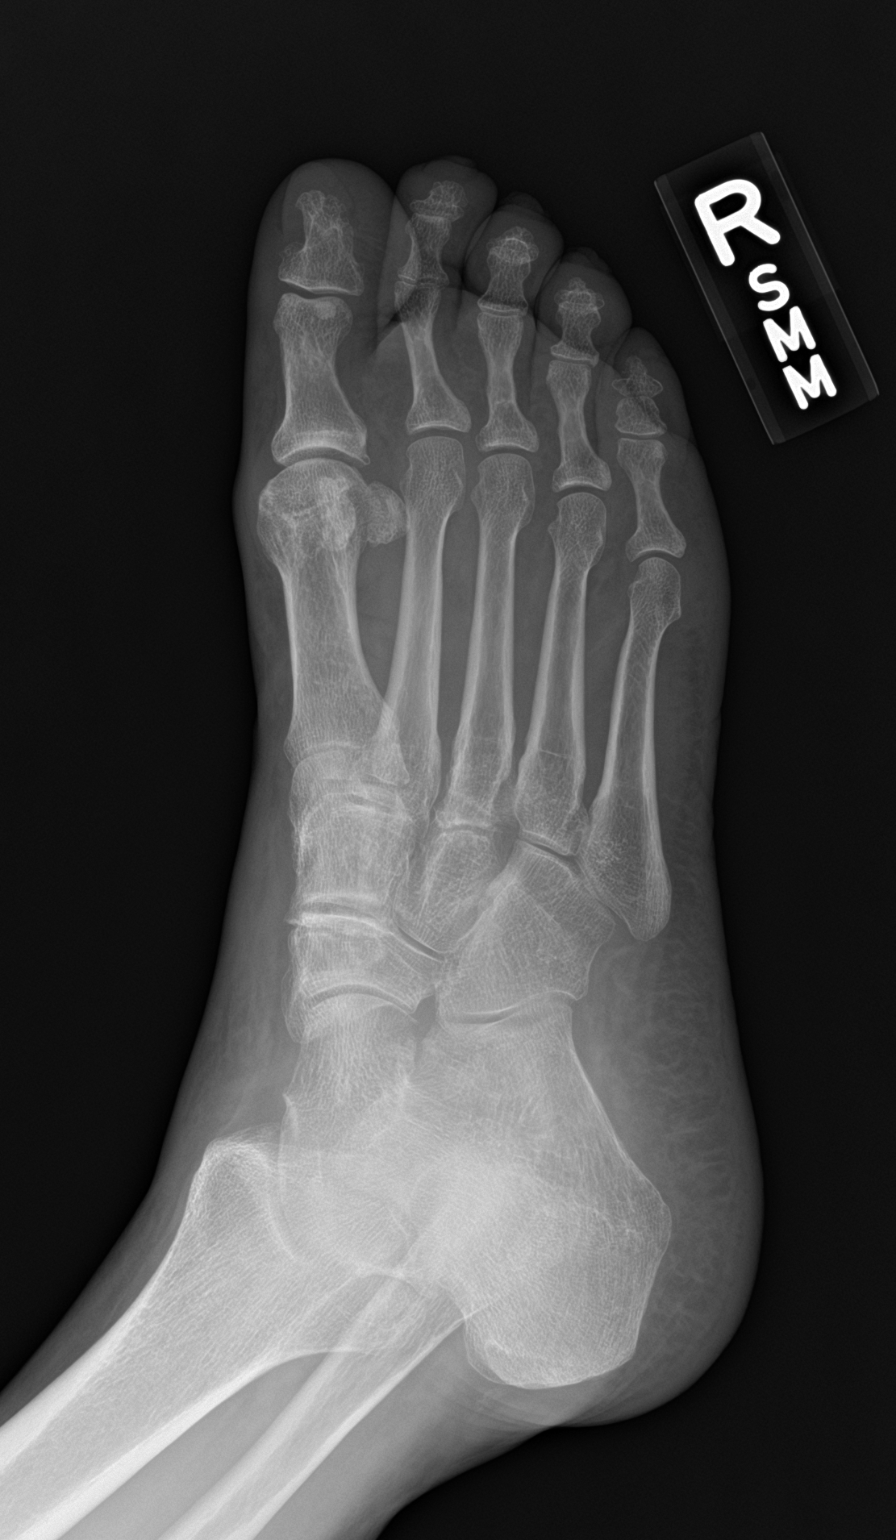

[foot lat]
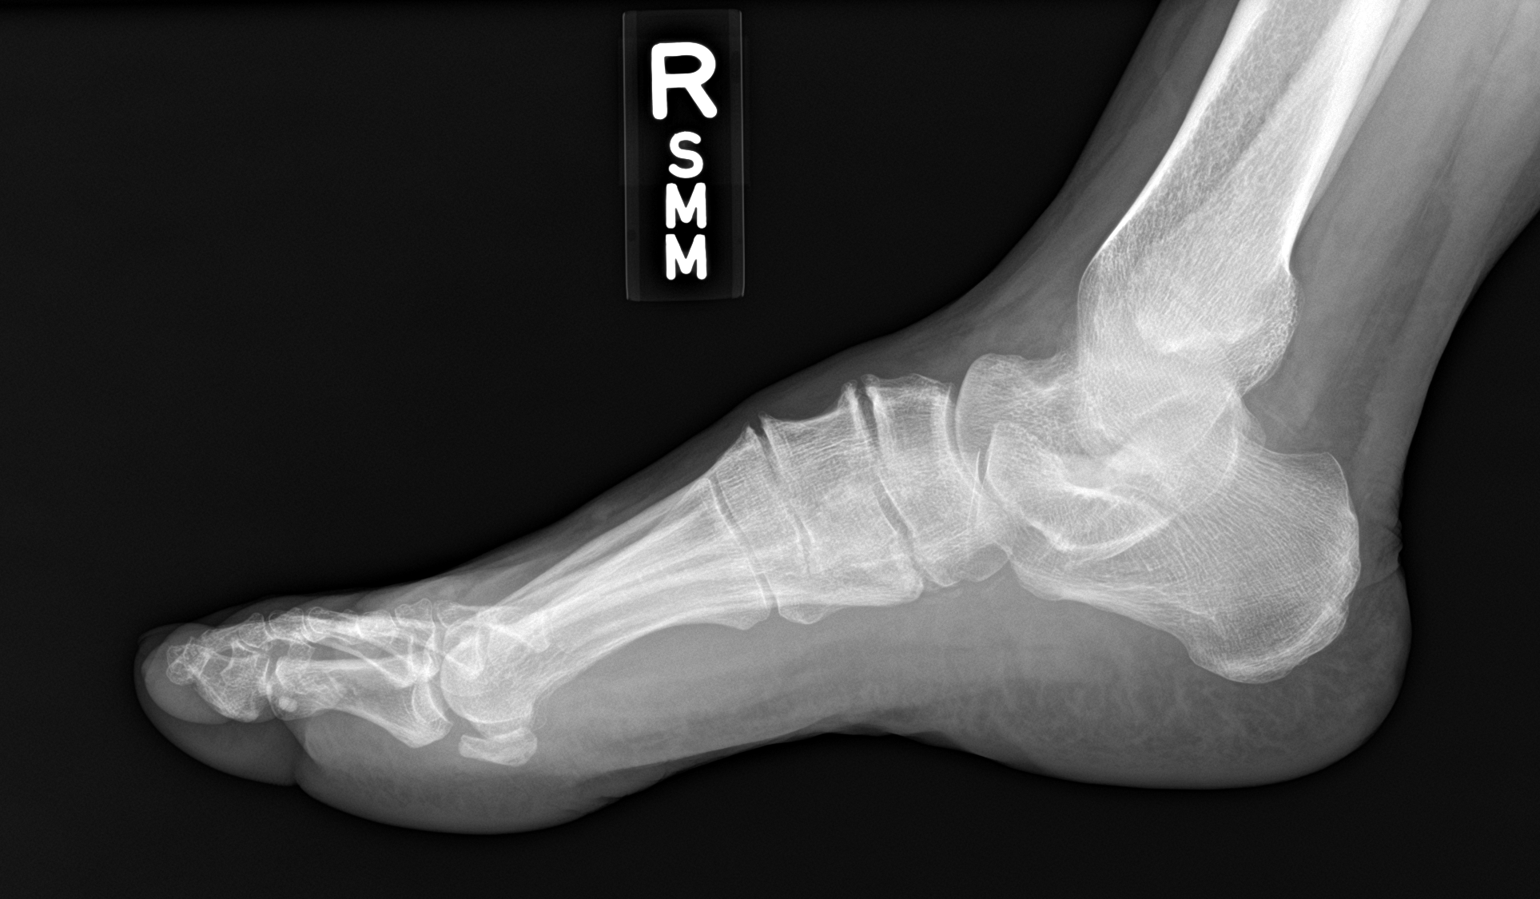

[3 of 3 positions shown; findings below may reference images not displayed]

FINDINGS: The bones are subjectively adequately mineralized. The phalanges and
metatarsals are intact. The interphalangeal, MTP, and TMT joint
spaces are well maintained. There is mild degenerative change of the
articulation of the medial border of the tarsal navicular with the
first cuneiform. No changes to suggest avascular necrosis are
observed. The talus and calcaneus appear intact as well. There is
mild soft tissue swelling dorsally and medially over the tarsals.
IMPRESSION: No acute fracture nor dislocation is observed. There are
degenerative changes centered within the medial tarsal bones. Given
the persistent symptoms, MRI would be a useful next imaging step.

## 2020-02-23 ENCOUNTER — Other Ambulatory Visit: Payer: Self-pay

## 2020-02-23 ENCOUNTER — Encounter (HOSPITAL_COMMUNITY): Payer: Self-pay

## 2020-02-23 ENCOUNTER — Ambulatory Visit (HOSPITAL_COMMUNITY)
Admission: EM | Admit: 2020-02-23 | Discharge: 2020-02-23 | Disposition: A | Discharge status: Home / Self Care | Payer: Medicare Other | Attending: Family Medicine | Admitting: Family Medicine

## 2020-02-23 DIAGNOSIS — Z1152 Encounter for screening for COVID-19: Secondary | ICD-10-CM

## 2020-02-23 DIAGNOSIS — U071 COVID-19: Secondary | ICD-10-CM | POA: Insufficient documentation

## 2020-02-23 NOTE — Discharge Instructions (Signed)

## 2020-02-23 NOTE — ED Triage Notes (Signed)
Patient states she has had a headache this am but does not have one now. 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 Pt would like to be tested. Pt is asymptomatic at this time.

## 2020-02-24 LAB — SARS CORONAVIRUS 2 (TAT 6-24 HRS): SARS Coronavirus 2: POSITIVE — AB

## 2020-02-25 ENCOUNTER — Telehealth: Payer: Self-pay | Admitting: Unknown Physician Specialty

## 2020-02-25 NOTE — Telephone Encounter (Signed)
Called to discuss with patient about COVID-19 symptoms and the use of one of the available treatments for those with mild to moderate Covid symptoms and at a high risk of hospitalization.  Pt appears to qualify for outpatient treatment due to co-morbid conditions and/or a member of an at-risk group in accordance with the FDA Emergency Use Authorization. However, she is asymptomatic at this time.      Gabriel Cirri

## 2020-05-02 ENCOUNTER — Ambulatory Visit: Payer: Medicare Other | Admitting: Cardiovascular Disease

## 2020-05-28 ENCOUNTER — Ambulatory Visit: Payer: Medicare Other | Admitting: Cardiovascular Disease

## 2020-06-12 ENCOUNTER — Encounter: Payer: Self-pay | Admitting: Cardiovascular Disease

## 2020-06-12 NOTE — Progress Notes (Signed)
Cardiology Office Note:    Date:  06/13/2020   ID:  Terri Winters, DOB [birth year], MRN [id number]  PCP:  System, Provider Not In  Cardiologist:  Kristeen Miss, MD   Referring MD: No ref. provider found   Chief Complaint  Patient presents with  . Hypertension      Terri Winters is a 65 y.o. female with a hx of essential hypertension. We asked her to see her today for further evaluation and management of her hypertension by Dr. Cliffton Asters.   Has had HTN for 7 years ago  Does not eat salt .  Does not eat salty foods.  Eats at home mostly .    Strong family hx of HTN .   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  She currently is on HCTZ 50 mg a day and losartan 100 mg a day.  Dec. 4, 2019:  Terri Winters is seen back for her hypertension today.  We started her on Aldactone and amlodipine at her last office visit.  Blood pressure is better but  remains mildly elevated. Is watching her salt.   She is recently been seen by her primary medical doctor.  Dr. Cliffton Asters made some changes but Terri Winters does not know what they are and does not have the new pills.  She did not bring any of her medicine bottles.   Aug. 19, 2020 :  Doing well.  Staying away from salt for the most part.   May 02, 2019:  Terri Winters is seen today for follow-up of her hypertension. Feels well.   Eating a low salt diet . Has been exercising some .  Does a fair amount of walking   Jun 13, 2020: Terri Winters is seen today for follow up of her HTN Walks quite a bit at work .   No CP    Past Medical History:  Diagnosis Date  . Asthma   . High cholesterol   . Hypertension   . Syncope     Past Surgical History:  Procedure Laterality Date  . CESAREAN SECTION      Current Medications: Current Meds  Medication Sig  . albuterol (PROVENTIL HFA;VENTOLIN HFA) 108 (90 Base) MCG/ACT inhaler Inhale  2 puffs into the lungs every 4 (four) hours as needed for wheezing or shortness of breath.  Marland Kitchen amLODipine (NORVASC) 5 MG tablet Take 1 tablet (5 mg total) by mouth daily.  . cetirizine (ZYRTEC) 10 MG tablet Take 1 tablet (10 mg total) by mouth daily.  . diclofenac sodium (VOLTAREN) 1 % GEL Apply 1 application topically 4 (four) times daily.  . hydrochlorothiazide (MICROZIDE) 12.5 MG capsule Take 12.5 mg by mouth daily.  Marland Kitchen ibuprofen (ADVIL) 600 MG tablet Take 1 tablet (600 mg total) by mouth 3 (three) times daily with meals.  Marland Kitchen spironolactone (ALDACTONE) 50 MG tablet Take 1 tablet (50 mg total) by mouth daily.  . [DISCONTINUED] amLODipine (NORVASC) 10 MG tablet Take 10 mg by mouth daily.  . [DISCONTINUED] montelukast (SINGULAIR) 10 MG tablet Take 10 mg by mouth every evening.  . [DISCONTINUED] penicillin v potassium (VEETID) 500 MG tablet Take 1 tablet (500 mg total) by mouth 3 (three) times daily.     Allergies:   Patient has no known allergies.   Social History   Socioeconomic History  . Marital status: Divorced    Spouse name: Not on file  .  Number of children: Not on file  . Years of education: Not on file  . Highest education level: Not on file  Occupational History  . Not on file  Tobacco Use  . Smoking status: Never Smoker  . Smokeless tobacco: Never Used  Vaping Use  . Vaping Use: Never used  Substance and Sexual Activity  . Alcohol use: No  . Drug use: No  . Sexual activity: Not Currently  Other Topics Concern  . Not on file  Social History Narrative  . Not on file   Social Determinants of Health   Financial Resource Strain: Not on file  Food Insecurity: Not on file  Transportation Needs: Not on file  Physical Activity: Not on file  Stress: Not on file  Social Connections: Not on file     Family History: The patient's family history includes Diabetes in her mother.  ROS:   Please see the history of present illness.     All other systems reviewed and are  negative.  EKGs/Labs/Other Studies Reviewed:    The following studies were reviewed today:   EKG:   Jun 13, 2020: Normal sinus rhythm at 81.  No ST or T wave changes.  Recent Labs: No results found for requested labs within last 8760 hours.  Recent Lipid Panel No results found for: CHOL, TRIG, HDL, CHOLHDL, VLDL, LDLCALC, LDLDIRECT  Physical Exam:     Physical Exam: Blood pressure (!) 130/96, pulse 81, height 5' (1.524 m), weight 128 lb (58.1 kg), SpO2 98 %.  GEN:  Well nourished, well developed in no acute distress HEENT: Normal NECK: No JVD; No carotid bruits LYMPHATICS: No lymphadenopathy CARDIAC: RRR , no murmurs, rubs, gallops RESPIRATORY:  Clear to auscultation without rales, wheezing or rhonchi  ABDOMEN: Soft, non-tender, non-distended MUSCULOSKELETAL:  No edema; No deformity  SKIN: Warm and dry NEUROLOGIC:  Alert and oriented x 3    ASSESSMENT:    1. Essential hypertension    PLAN:      Hypertension:   She is feeling fairly well.  Her systolic blood pressure is ideal but her diastolic blood pressure remains mildly elevated.  We will increase the spironolactone to 50 mg a day.  We will reduce the amlodipine to 5 mg a day.  We will check a basic metabolic profile in 2 to 3 weeks.  I will have her return to see an APP in 6 months and I will plan on seeing her again in 1 year. .       Medication Adjustments/Labs and Tests Ordered: Current medicines are reviewed at length with the patient today.  Concerns regarding medicines are outlined above.  Orders Placed This Encounter  Procedures  . Basic metabolic panel  . EKG 12-Lead   Meds ordered this encounter  Medications  . amLODipine (NORVASC) 5 MG tablet    Sig: Take 1 tablet (5 mg total) by mouth daily.    Dispense:  90 tablet    Refill:  3  . spironolactone (ALDACTONE) 50 MG tablet    Sig: Take 1 tablet (50 mg total) by mouth daily.    Dispense:  90 tablet    Refill:  3     Patient Instructions   Medication Instructions:  Your physician has recommended you make the following change in your medication:   DECREASE Amlodipine to 5mg  daily INCREASE Spironolactone to 50mg  daily  *If you need a refill on your cardiac medications before your next appointment, please call your pharmacy*   Lab  Work: Public affairs consultant recommends that you return for lab work in: 2-3 weeks     Testing/Procedures: none   Follow-Up: At [facility], you and your health needs are our priority.  As part of our continuing mission to provide you with exceptional heart care, we have created designated Provider Care Teams.  These Care Teams include your primary Cardiologist (physician) and Advanced Practice Providers (APPs -  Physician Assistants and Nurse Practitioners) who all work together to provide you with the care you need, when you need it.  We recommend signing up for the patient portal called "MyChart".  Sign up information is provided on this After Visit Summary.  MyChart is used to connect with patients for Virtual Visits (Telemedicine).  Patients are able to view lab/test results, encounter notes, upcoming appointments, etc.  Non-urgent messages can be sent to your provider as well.   To learn more about what you can do with MyChart, go to [email or web address].    Your next appointment:   6 month(s)  The format for your next appointment:   In Person  Provider:   You will see one of the following Advanced Practice Providers on your designated Care Team:    [name], PA-C  Chelsea Aus, [place]       Signed, Kristeen Miss, MD  06/13/2020 10:54 AM    [facility]

## 2020-06-13 ENCOUNTER — Other Ambulatory Visit: Payer: Self-pay

## 2020-06-13 ENCOUNTER — Encounter: Payer: Self-pay | Admitting: Cardiovascular Disease

## 2020-06-13 ENCOUNTER — Ambulatory Visit (INDEPENDENT_AMBULATORY_CARE_PROVIDER_SITE_OTHER): Payer: Medicare Other | Admitting: Cardiovascular Disease

## 2020-06-13 ENCOUNTER — Encounter (INDEPENDENT_AMBULATORY_CARE_PROVIDER_SITE_OTHER): Payer: Self-pay

## 2020-06-13 VITALS — BP 130/96 | HR 81 | Ht 60.0 in | Wt 128.0 lb

## 2020-06-13 DIAGNOSIS — I10 Essential (primary) hypertension: Secondary | ICD-10-CM | POA: Diagnosis not present

## 2020-06-13 MED ORDER — AMLODIPINE BESYLATE 5 MG PO TABS: 5.0000 mg | ORAL_TABLET | Freq: Every day | ORAL | 3 refills | Status: AC

## 2020-06-13 MED ORDER — SPIRONOLACTONE 50 MG PO TABS: 50.0000 mg | ORAL_TABLET | Freq: Every day | ORAL | 3 refills | Status: AC

## 2020-06-13 NOTE — Patient Instructions (Addendum)
Medication Instructions:  Your physician has recommended you make the following change in your medication:   DECREASE Amlodipine to 5mg  daily INCREASE Spironolactone to 50mg  daily  *If you need a refill on your cardiac medications before your next appointment, please call your pharmacy*   Lab Work: BMET     07/01/2020 anytime between 7:30-4:30pm.  Your physician recommends that you return for lab work in: 2-3 weeks     Testing/Procedures: none   Follow-Up: At [facility], you and your health needs are our priority.  As part of our continuing mission to provide you with exceptional heart care, we have created designated Provider Care Teams.  These Care Teams include your primary Cardiologist (physician) and Advanced Practice Providers (APPs -  Physician Assistants and Nurse Practitioners) who all work together to provide you with the care you need, when you need it.  We recommend signing up for the patient portal called "MyChart".  Sign up information is provided on this After Visit Summary.  MyChart is used to connect with patients for Virtual Visits (Telemedicine).  Patients are able to view lab/test results, encounter notes, upcoming appointments, etc.  Non-urgent messages can be sent to your provider as well.   To learn more about what you can do with MyChart, go to 07/03/2020.    Your next appointment:   6 month(s)  The format for your next appointment:   In Person  Provider:   You will see one of the following Advanced Practice Providers on your designated Care Team:    [facility], PA-C  [place], [name]

## 2020-07-01 ENCOUNTER — Other Ambulatory Visit: Payer: Medicare Other

## 2020-07-03 ENCOUNTER — Other Ambulatory Visit: Payer: Medicare Other

## 2020-07-28 ENCOUNTER — Other Ambulatory Visit: Payer: Medicare Other

## 2020-07-28 ENCOUNTER — Other Ambulatory Visit: Payer: Self-pay

## 2020-07-28 DIAGNOSIS — I10 Essential (primary) hypertension: Secondary | ICD-10-CM

## 2020-07-29 LAB — BASIC METABOLIC PANEL
BUN/Creatinine Ratio: 19 (ref 12–28)
BUN: 14 mg/dL (ref 8–27)
CO2: 25 mmol/L (ref 20–29)
Calcium: 9.6 mg/dL (ref 8.7–10.3)
Chloride: 101 mmol/L (ref 96–106)
Creatinine, Ser: 0.75 mg/dL (ref 0.57–1.00)
Glucose: 102 mg/dL — ABNORMAL HIGH (ref 65–99)
Potassium: 3.8 mmol/L (ref 3.5–5.2)
Sodium: 141 mmol/L (ref 134–144)
eGFR: 87 mL/min/{1.73_m2} (ref >59)

## 2020-08-18 ENCOUNTER — Other Ambulatory Visit: Payer: Self-pay | Admitting: Nurse Practitioner

## 2020-08-18 DIAGNOSIS — Z13820 Encounter for screening for osteoporosis: Secondary | ICD-10-CM

## 2021-04-09 ENCOUNTER — Other Ambulatory Visit: Payer: Self-pay

## 2021-04-09 ENCOUNTER — Ambulatory Visit (HOSPITAL_COMMUNITY)
Admission: EM | Admit: 2021-04-09 | Discharge: 2021-04-09 | Disposition: A | Discharge status: Home / Self Care | Payer: Medicare Other | Attending: Family Medicine | Admitting: Family Medicine

## 2021-04-09 ENCOUNTER — Encounter (HOSPITAL_COMMUNITY): Payer: Self-pay | Admitting: *Deleted

## 2021-04-09 DIAGNOSIS — M549 Dorsalgia, unspecified: Secondary | ICD-10-CM | POA: Diagnosis not present

## 2021-04-09 NOTE — Discharge Instructions (Addendum)
You can take tylenol 500 mg, 2 every 4 hours as needed for pain.  You can also use a heating pad on the sore areas. ?
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
## 2021-04-29 ENCOUNTER — Ambulatory Visit (HOSPITAL_COMMUNITY)
Admission: EM | Admit: 2021-04-29 | Discharge: 2021-04-29 | Disposition: A | Discharge status: Home / Self Care | Payer: Medicare Other | Attending: Family Medicine | Admitting: Family Medicine

## 2021-04-29 ENCOUNTER — Other Ambulatory Visit: Payer: Self-pay

## 2021-04-29 ENCOUNTER — Encounter (HOSPITAL_COMMUNITY): Payer: Self-pay | Admitting: Emergency Medicine

## 2021-04-29 DIAGNOSIS — M62838 Other muscle spasm: Secondary | ICD-10-CM | POA: Diagnosis not present

## 2021-04-29 DIAGNOSIS — S161XXA Strain of muscle, fascia and tendon at neck level, initial encounter: Secondary | ICD-10-CM | POA: Diagnosis not present

## 2021-04-29 MED ORDER — TIZANIDINE HCL 2 MG PO CAPS: 2.0000 mg | ORAL_CAPSULE | Freq: Three times a day (TID) | ORAL | 0 refills | Status: AC
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
## 2022-08-18 ENCOUNTER — Other Ambulatory Visit: Payer: Self-pay

## 2022-08-18 DIAGNOSIS — Z1231 Encounter for screening mammogram for malignant neoplasm of breast: Secondary | ICD-10-CM

## 2022-09-10 ENCOUNTER — Inpatient Hospital Stay: Admission: RE | Admit: 2022-09-10 | Payer: Medicare Other | Source: Ambulatory Visit

## 2022-11-15 ENCOUNTER — Ambulatory Visit (HOSPITAL_COMMUNITY)
Admission: EM | Admit: 2022-11-15 | Discharge: 2022-11-15 | Disposition: A | Discharge status: Home / Self Care | Payer: Self-pay | Attending: Internal Medicine | Admitting: Internal Medicine

## 2022-11-15 ENCOUNTER — Ambulatory Visit (INDEPENDENT_AMBULATORY_CARE_PROVIDER_SITE_OTHER): Payer: Self-pay

## 2022-11-15 ENCOUNTER — Other Ambulatory Visit: Payer: Self-pay

## 2022-11-15 ENCOUNTER — Encounter (HOSPITAL_COMMUNITY): Payer: Self-pay | Admitting: Emergency Medicine

## 2022-11-15 DIAGNOSIS — S92501A Displaced unspecified fracture of right lesser toe(s), initial encounter for closed fracture: Secondary | ICD-10-CM

## 2022-11-15 MED ORDER — IBUPROFEN 600 MG PO TABS: 600.0000 mg | ORAL_TABLET | Freq: Four times a day (QID) | ORAL | 0 refills | Status: AC | PRN

## 2022-11-15 NOTE — Discharge Instructions (Addendum)
Rest and elevate the affected painful area.   Apply cold compresses intermittently as needed.   Please take medications as prescribed. As pain recedes, begin normal activities slowly as tolerated.  Please return to urgent care if you have worsening or persistent symptoms

## 2022-11-15 NOTE — ED Triage Notes (Signed)
Patient fell last Tuesday and a stool fell on her right foot and has been having pain since. States it is swollen and sharp pains.

## 2022-11-16 NOTE — ED Provider Notes (Signed)
[facility]    CSN: [id number] Arrival date & time: 11/15/22  1921      History   Chief Complaint Chief Complaint  Patient presents with   Foot Pain    HPI Terri Winters is a 67 y.o. female comes to the urgent care with right foot pain after the stool fell on her right foot.  Pain is sharp and throbbing, constant and of moderate severity.  Patient has difficulty in bearing weight on the right foot.  No known relieving factors.  Bruising on the dorsum of the left foot more pronounced on the third toe.  Patient denies any numbness or tingling of the feet.  HPI  Past Medical History:  Diagnosis Date   Asthma    High cholesterol    Hypertension    Syncope     Patient Active Problem List   Diagnosis Date Noted   HTN (hypertension) 09/27/2018    Past Surgical History:  Procedure Laterality Date   CESAREAN SECTION      OB History   No obstetric history on file.      Home Medications    Prior to Admission medications   Medication Sig Start Date End Date Taking? Authorizing Provider  ibuprofen (ADVIL) 600 MG tablet Take 1 tablet (600 mg total) by mouth every 6 (six) hours as needed. 11/15/22  Yes Tekeshia Klahr, Britta Mccreedy, MD  albuterol (PROVENTIL HFA;VENTOLIN HFA) 108 (90 Base) MCG/ACT inhaler Inhale 2 puffs into the lungs every 4 (four) hours as needed for wheezing or shortness of breath. 12/22/15   Hayden Rasmussen, NP  amLODipine (NORVASC) 5 MG tablet Take 1 tablet (5 mg total) by mouth daily. 06/13/20   Nahser, Deloris Ping, MD  cetirizine (ZYRTEC) 10 MG tablet Take 1 tablet (10 mg total) by mouth daily. 04/24/15   Barbaraann Barthel, MD  diclofenac sodium (VOLTAREN) 1 % GEL Apply 1 application topically 4 (four) times daily. 12/22/15   Hayden Rasmussen, NP  hydrochlorothiazide (MICROZIDE) 12.5 MG capsule Take 12.5 mg by mouth daily.    [provider]  spironolactone (ALDACTONE) 50 MG tablet Take 1 tablet (50 mg total) by mouth daily. 06/13/20   Nahser, Deloris Ping, MD   tizanidine (ZANAFLEX) 2 MG capsule Take 1 capsule (2 mg total) by mouth 3 (three) times daily. 04/29/21   Mardella Layman, MD    Family History Family History  Problem Relation Age of Onset   Diabetes Mother     Social History Social History   Tobacco Use   Smoking status: Never   Smokeless tobacco: Never  Vaping Use   Vaping status: Never Used  Substance Use Topics   Alcohol use: No   Drug use: No     Allergies   Patient has no allergy information on record.   Review of Systems Review of Systems As per HPI  Physical Exam Triage Vital Signs ED Triage Vitals  Encounter Vitals Group     BP 11/15/22 2018 (!) 201/122     Systolic BP Percentile --      Diastolic BP Percentile --      Pulse Rate 11/15/22 2018 97     Resp 11/15/22 2018 18     Temp 11/15/22 2018 97.9 F (36.6 C)     Temp Source 11/15/22 2018 Oral     SpO2 11/15/22 2018 96 %     Weight --      Height --      Head Circumference --  Peak Flow --      Pain Score 11/15/22 2017 7     Pain Loc --      Pain Education --      Exclude from Growth Chart --    No data found.  Updated Vital Signs BP (!) 205/112 (BP Location: Right Arm)   Pulse 96   Temp 97.9 F (36.6 C) (Oral)   Resp 18   SpO2 96%   Visual Acuity Right Eye Distance:   Left Eye Distance:   Bilateral Distance:    Right Eye Near:   Left Eye Near:    Bilateral Near:     Physical Exam Vitals and nursing note reviewed.  Constitutional:      General: She is not in acute distress.    Appearance: She is not ill-appearing.  Cardiovascular:     Rate and Rhythm: Normal rate and regular rhythm.  Musculoskeletal:     Comments: Tenderness on palpation of the dorsum aspect of the right foot.  Third toe of the right foot is exquisitely tender and is bruised.  Mild swelling of the third toe but no obvious deformity.  Neurological:     Mental Status: She is alert.      UC Treatments / Results  Labs (all labs ordered are listed, but  only abnormal results are displayed) Labs Reviewed - No data to display  EKG   Radiology DG Foot Complete Right  Result Date: 11/15/2022 CLINICAL DATA:  Fall, right foot pain EXAM: RIGHT FOOT COMPLETE - 3+ VIEW COMPARISON:  03/02/2017 FINDINGS: Normal alignment. No acute fracture or dislocation. Moderate midfoot degenerative arthritis, particularly at the navicular articulation with the medial and middle cuneiform. This appears stable since prior examination. Mild degenerative arthritis of the first MTP joint. Soft tissues are unremarkable. IMPRESSION: 1. Degenerative arthritis. No acute fracture or dislocation. Electronically Signed   By: Helyn Numbers M.D.   On: 11/15/2022 21:10    Procedures Procedures (including critical care time)  Medications Ordered in UC Medications - No data to display  Initial Impression / Assessment and Plan / UC Course  I have reviewed the triage vital signs and the nursing notes.  Pertinent labs & imaging results that were available during my care of the patient were reviewed by me and considered in my medical decision making (see chart for details).     1.  Trauma to the right foot: X-ray of the right foot independently read by me is worrisome for possible nondisplaced fracture of the proximal third toe. Postop shoe Ibuprofen 600 mg every 6 hours as needed for pain Icing of the right foot. Return precautions given. Will wait for the final read from orthopedic surgery. If symptoms persist patient may need to be seen by orthopedic surgery. Final Clinical Impressions(s) / UC Diagnoses   Final diagnoses:  Closed fracture of phalanx of right third toe, initial encounter     Discharge Instructions      Rest and elevate the affected painful area.   Apply cold compresses intermittently as needed.   Please take medications as prescribed. As pain recedes, begin normal activities slowly as tolerated.  Please return to urgent care if you have worsening  or persistent symptoms    ED Prescriptions     Medication Sig Dispense Auth. Provider   ibuprofen (ADVIL) 600 MG tablet Take 1 tablet (600 mg total) by mouth every 6 (six) hours as needed. 30 tablet Kratos Ruscitti, Britta Mccreedy, MD      PDMP not  reviewed this encounter.   Merrilee Jansky, MD 11/16/22 1710

## 2023-01-13 ENCOUNTER — Other Ambulatory Visit: Payer: Self-pay | Admitting: Family

## 2023-01-13 ENCOUNTER — Other Ambulatory Visit: Payer: Self-pay

## 2023-01-13 DIAGNOSIS — R5381 Other malaise: Secondary | ICD-10-CM

## 2023-01-13 DIAGNOSIS — Z1231 Encounter for screening mammogram for malignant neoplasm of breast: Secondary | ICD-10-CM

## 2023-01-20 ENCOUNTER — Other Ambulatory Visit: Payer: Self-pay | Admitting: Family

## 2023-01-20 DIAGNOSIS — Z13820 Encounter for screening for osteoporosis: Secondary | ICD-10-CM

## 2023-12-27 ENCOUNTER — Encounter (HOSPITAL_COMMUNITY): Payer: Self-pay

## 2023-12-27 ENCOUNTER — Emergency Department (HOSPITAL_COMMUNITY)
Admission: EM | Admit: 2023-12-27 | Discharge: 2023-12-27 | Disposition: A | Discharge status: Home / Self Care | Payer: Self-pay | Attending: Emergency Medicine | Admitting: Emergency Medicine

## 2023-12-27 ENCOUNTER — Emergency Department (HOSPITAL_COMMUNITY): Payer: Self-pay

## 2023-12-27 ENCOUNTER — Other Ambulatory Visit: Payer: Self-pay

## 2023-12-27 DIAGNOSIS — R55 Syncope and collapse: Secondary | ICD-10-CM | POA: Insufficient documentation

## 2023-12-27 DIAGNOSIS — I10 Essential (primary) hypertension: Secondary | ICD-10-CM | POA: Insufficient documentation

## 2023-12-27 DIAGNOSIS — Z79899 Other long term (current) drug therapy: Secondary | ICD-10-CM | POA: Insufficient documentation

## 2023-12-27 DIAGNOSIS — R824 Acetonuria: Secondary | ICD-10-CM | POA: Insufficient documentation

## 2023-12-27 DIAGNOSIS — I951 Orthostatic hypotension: Secondary | ICD-10-CM

## 2023-12-27 DIAGNOSIS — R8289 Other abnormal findings on cytological and histological examination of urine: Secondary | ICD-10-CM | POA: Insufficient documentation

## 2023-12-27 LAB — URINALYSIS, ROUTINE W REFLEX MICROSCOPIC
Bilirubin Urine: NEGATIVE
Glucose, UA: NEGATIVE mg/dL
Hgb urine dipstick: NEGATIVE
Ketones, ur: 20 mg/dL — AB
Nitrite: NEGATIVE
Protein, ur: 30 mg/dL — AB
Specific Gravity, Urine: 1.025 (ref 1.005–1.030)
pH: 5 (ref 5.0–8.0)

## 2023-12-27 LAB — COMPREHENSIVE METABOLIC PANEL WITH GFR
ALT: 13 U/L (ref 0–44)
AST: 20 U/L (ref 15–41)
Albumin: 3.6 g/dL (ref 3.5–5.0)
Alkaline Phosphatase: 67 U/L (ref 38–126)
Anion gap: 11 (ref 5–15)
BUN: 11 mg/dL (ref 8–23)
CO2: 24 mmol/L (ref 22–32)
Calcium: 9.1 mg/dL (ref 8.9–10.3)
Chloride: 102 mmol/L (ref 98–111)
Creatinine, Ser: 0.62 mg/dL (ref 0.44–1.00)
GFR, Estimated: >60 mL/min (ref >60)
Glucose, Bld: 107 mg/dL — ABNORMAL HIGH (ref 70–99)
Potassium: 3.3 mmol/L — ABNORMAL LOW (ref 3.5–5.1)
Sodium: 137 mmol/L (ref 135–145)
Total Bilirubin: 0.6 mg/dL (ref 0.0–1.2)
Total Protein: 7.7 g/dL (ref 6.5–8.1)

## 2023-12-27 LAB — CBC
HCT: 33.7 % — ABNORMAL LOW (ref 36.0–46.0)
Hemoglobin: 11.2 g/dL — ABNORMAL LOW (ref 12.0–15.0)
MCH: 29.9 pg (ref 26.0–34.0)
MCHC: 33.2 g/dL (ref 30.0–36.0)
MCV: 90.1 fL (ref 80.0–100.0)
Platelets: 271 K/uL (ref 150–400)
RBC: 3.74 MIL/uL — ABNORMAL LOW (ref 3.87–5.11)
RDW: 13.6 % (ref 11.5–15.5)
WBC: 10.3 K/uL (ref 4.0–10.5)
nRBC: 0 % (ref 0.0–0.2)

## 2023-12-27 LAB — CBG MONITORING, ED: Glucose-Capillary: 87 mg/dL (ref 70–99)

## 2023-12-27 MED ORDER — SODIUM CHLORIDE 0.9 % IV BOLUS
1000.0000 mL | Freq: Once | INTRAVENOUS | Status: AC
  Administered 2023-12-27: 1000 mL via INTRAVENOUS

## 2023-12-27 MED ORDER — POTASSIUM CHLORIDE CRYS ER 20 MEQ PO TBCR
40.0000 meq | EXTENDED_RELEASE_TABLET | Freq: Once | ORAL | Status: AC
  Administered 2023-12-27: 40 meq via ORAL
  Filled 2023-12-27: qty 2

## 2023-12-27 NOTE — ED Notes (Signed)
 I will get orthostatic vitals when her fluids finish per nurse

## 2023-12-27 NOTE — ED Provider Notes (Signed)
 [place] [facility] AT [facility] Provider Note   CSN: [id number] Arrival date & time: 12/27/23  1459     Patient presents with: Loss of Consciousness   Terri Winters is a 68 y.o. female.   Patient is a 68 year old female with past medical history of hypertension presenting to the emergency department with syncope.  Patient states that she was at work today when she started to feel funny like she might pass out and then had a syncopal episode and fell into a laundry basket.  The patient denied any associated chest pain, shortness of breath, nausea, vomiting or diaphoresis.  She states that she has been otherwise feeling well recently without any recent fever, nausea, vomiting or diarrhea.  She denies any recent medication changes.  Of note when she was evaluated by EMS did have positive orthostatic vitals.  The history is provided by the patient.  Loss of Consciousness      Prior to Admission medications   Medication Sig Start Date End Date Taking? Authorizing Provider  albuterol  (PROVENTIL  HFA;VENTOLIN  HFA) 108 (90 Base) MCG/ACT inhaler Inhale 2 puffs into the lungs every 4 (four) hours as needed for wheezing or shortness of breath. 12/22/15   Tharon Lenis, NP  amLODipine  (NORVASC ) 5 MG tablet Take 1 tablet (5 mg total) by mouth daily. 06/13/20   Nahser, Aleene PARAS, MD  cetirizine  (ZYRTEC ) 10 MG tablet Take 1 tablet (10 mg total) by mouth daily. 04/24/15   Sharlet Lynwood KIDD, MD  diclofenac  sodium (VOLTAREN ) 1 % GEL Apply 1 application topically 4 (four) times daily. 12/22/15   Tharon Lenis, NP  hydrochlorothiazide  (MICROZIDE ) 12.5 MG capsule Take 12.5 mg by mouth daily.    [provider]  ibuprofen  (ADVIL ) 600 MG tablet Take 1 tablet (600 mg total) by mouth every 6 (six) hours as needed. 11/15/22   LampteyAleene KIDD, MD  spironolactone  (ALDACTONE ) 50 MG tablet Take 1 tablet (50 mg total) by mouth daily. 06/13/20   Nahser, Aleene PARAS, MD  tizanidine   (ZANAFLEX ) 2 MG capsule Take 1 capsule (2 mg total) by mouth 3 (three) times daily. 04/29/21   Rolinda Rogue, MD    Allergies: Patient has no allergy information on record.    Review of Systems  Cardiovascular:  Positive for syncope.    Updated Vital Signs BP (!) 165/88 (BP Location: Left Arm)   Pulse 86   Temp 98 F (36.7 C) (Oral)   Resp 16   SpO2 97%   Physical Exam Vitals and nursing note reviewed.  Constitutional:      General: She is not in acute distress.    Appearance: Normal appearance.  HENT:     Head: Normocephalic and atraumatic.     Nose: Nose normal.     Mouth/Throat:     Mouth: Mucous membranes are dry.     Pharynx: Oropharynx is clear.  Eyes:     Extraocular Movements: Extraocular movements intact.     Conjunctiva/sclera: Conjunctivae normal.  Cardiovascular:     Rate and Rhythm: Normal rate and regular rhythm.     Heart sounds: Normal heart sounds.  Pulmonary:     Effort: Pulmonary effort is normal.     Breath sounds: Normal breath sounds.  Abdominal:     General: Abdomen is flat.     Palpations: Abdomen is soft.     Tenderness: There is no abdominal tenderness.  Musculoskeletal:        General: Normal range of motion.  Cervical back: Normal range of motion.     Right lower leg: No edema.     Left lower leg: No edema.  Skin:    General: Skin is warm and dry.  Neurological:     General: No focal deficit present.     Mental Status: She is alert and oriented to person, place, and time.  Psychiatric:        Mood and Affect: Mood normal.        Behavior: Behavior normal.     (all labs ordered are listed, but only abnormal results are displayed) Labs Reviewed  COMPREHENSIVE METABOLIC PANEL WITH GFR - Abnormal; Notable for the following components:      Result Value   Potassium 3.3 (*)    Glucose, Bld 107 (*)    All other components within normal limits  CBC - Abnormal; Notable for the following components:   RBC 3.74 (*)    Hemoglobin  11.2 (*)    HCT 33.7 (*)    All other components within normal limits  URINALYSIS, ROUTINE W REFLEX MICROSCOPIC - Abnormal; Notable for the following components:   APPearance HAZY (*)    Ketones, ur 20 (*)    Protein, ur 30 (*)    Leukocytes,Ua MODERATE (*)    Bacteria, UA RARE (*)    All other components within normal limits  CK  CBG MONITORING, ED    EKG: EKG Interpretation Date/Time:  Tuesday December 27 2023 15:41:08 EST Ventricular Rate:  86 PR Interval:  147 QRS Duration:  78 QT Interval:  356 QTC Calculation: 426 R Axis:   43  Text Interpretation: Sinus rhythm Probable left atrial enlargement Borderline T wave abnormalities No significant change since last tracing Confirmed by Ellouise Fine ([id number]) on 12/27/2023 5:44:57 PM  Radiology: CT CERVICAL SPINE WO CONTRAST Result Date: 12/27/2023 CLINICAL DATA:  Syncope. EXAM: CT CERVICAL SPINE WITHOUT CONTRAST TECHNIQUE: Multidetector CT imaging of the cervical spine was performed without intravenous contrast. Multiplanar CT image reconstructions were also generated. RADIATION DOSE REDUCTION: This exam was performed according to the departmental dose-optimization program which includes automated exposure control, adjustment of the mA and/or kV according to patient size and/or use of iterative reconstruction technique. COMPARISON:  None Available. FINDINGS: Alignment: Normal. Skull base and vertebrae: No acute fracture. No primary bone lesion or focal pathologic process. Soft tissues and spinal canal: No prevertebral fluid or swelling. No visible canal hematoma. Disc levels: Marked severity endplate sclerosis is seen at the level of C6-C7, with mild endplate sclerosis present throughout the remainder of the cervical spine. Mild anterior osteophyte formation is also present at the levels of C3-C4, C4-C5, C5-C6 and C6-C7. There is mild to moderate severity intervertebral disc space narrowing at the level of C6-C7, with mild intervertebral  disc space narrowing present throughout the remainder of the cervical spine. Bilateral mild-to-moderate severity multilevel facet joint hypertrophy is noted. Upper chest: There is mild posterior right upper lobe scarring, atelectasis and/or infiltrate. Other: There is marked severity bilateral maxillary sinus, bilateral ethmoid sinus and sphenoid sinus mucosal thickening. IMPRESSION: 1. No acute fracture or subluxation of the cervical spine. 2. Multilevel degenerative changes, most prominent at the level of C6-C7. 3. Mild posterior right upper lobe scarring, atelectasis and/or infiltrate. 4. Marked severity bilateral maxillary sinus, bilateral ethmoid sinus and sphenoid sinus disease. Electronically Signed   By: Suzen Dials M.D.   On: 12/27/2023 16:57   CT HEAD WO CONTRAST Result Date: 12/27/2023 CLINICAL DATA:  Syncopal episode. EXAM: CT  HEAD WITHOUT CONTRAST TECHNIQUE: Contiguous axial images were obtained from the base of the skull through the vertex without intravenous contrast. RADIATION DOSE REDUCTION: This exam was performed according to the departmental dose-optimization program which includes automated exposure control, adjustment of the mA and/or kV according to patient size and/or use of iterative reconstruction technique. COMPARISON:  None Available. FINDINGS: Brain: No evidence of acute infarction, hemorrhage, hydrocephalus, extra-axial collection or mass lesion/mass effect. There are areas of decreased attenuation within the white matter tracts of the supratentorial brain, consistent with microvascular disease changes. Vascular: Moderate to marked severity bilateral cavernous carotid artery calcification is noted. Skull: Normal. Negative for fracture or focal lesion. Sinuses/Orbits: There is marked severity bilateral maxillary sinus, bilateral ethmoid sinus and sphenoid sinus mucosal thickening. Other: None. IMPRESSION: 1. No acute intracranial abnormality. 2. Marked severity bilateral  maxillary sinus, bilateral ethmoid sinus and sphenoid sinus disease. Electronically Signed   By: Suzen Dials M.D.   On: 12/27/2023 16:53     Procedures   Medications Ordered in the ED  sodium chloride  0.9 % bolus 1,000 mL (0 mLs Intravenous Stopped 12/27/23 1932)  potassium chloride SA (KLOR-CON M) CR tablet 40 mEq (40 mEq Oral Given 12/27/23 1847)    Clinical Course as of 12/27/23 2045  Tue Dec 27, 2023  2037 Orthostatics negative after fluids. She is stable for discharge home with outpatient follow up. [VK]    Clinical Course User Index [VK] Kingsley, Verlene Glantz K, DO                                 Medical Decision Making This patient presents to the ED with chief complaint(s) of syncope with pertinent past medical history of hypertension which further complicates the presenting complaint. The complaint involves an extensive differential diagnosis and also carries with it a high risk of complications and morbidity.    The differential diagnosis includes arrhythmia, anemia, dehydration, electrolyte abnormality, orthostatic syncope, vasovagal syncope, medication side effect  Additional history obtained: Additional history obtained from family Records reviewed Primary Care Documents  ED Course and Reassessment: On patient's arrival she is hemodynamically stable in no acute distress.  She was initially evaluated in triage and had EKG, labs and CT imaging performed.  Patient's EKG showed normal sinus rhythm without acute ischemic changes, labs showed mild ketonuria, moderate leuks and rare bacteria in the urine.  She has no urinary symptoms making a UTI unlikely.  She had mild hypokalemia, likely related to her hydrochlorothiazide .  With her positive orthostatics for EMS, will be given fluids and potassium and have repeat orthostatics here.  If asymptomatic, plan will be for discharge with outpatient follow-up.  Independent labs interpretation:  The following labs were  independently interpreted: within normal range  Independent visualization of imaging: - I independently visualized the following imaging with scope of interpretation limited to determining acute life threatening conditions related to emergency care: CTH/Cspine, which revealed no acute traumatic injury  Consultation: - Consulted or discussed management/test interpretation w/ external professional: N/A  Consideration for admission or further workup: Patient has no emergent conditions requiring admission or further work-up at this time and is stable for discharge home with primary care follow-up  Social Determinants of health: N/A    Amount and/or Complexity of Data Reviewed Labs: ordered.  Risk Prescription drug management.       Final diagnoses:  Orthostatic syncope    ED Discharge Orders     None  Kingsley, Derick Seminara K, DO 12/27/23 2045

## 2023-12-27 NOTE — Discharge Instructions (Signed)
 You were seen in the emergency department after your episode of passing out.  Your blood pressure initially dropped significantly when you went from sitting to standing and this was likely due to what we call orthostatic syncope.  You can wear compression stockings to help get your blood flow from your legs up to your heart and your head quicker and can drink plenty of fluids to stay well-hydrated.  You should follow-up with your primary doctor to have your symptoms rechecked.  You should return to the emergency department if you are having recurrent episodes of passing out, severe chest pain or shortness of breath or any other new or concerning symptoms.

## 2023-12-27 NOTE — ED Triage Notes (Signed)
 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  Ortho positive 150 sitting and stood up 108. Pt did take valacyclovir that is not prescribed to her. A&Ox4  BP 150/80 CBG 148

## 2023-12-27 NOTE — ED Provider Triage Note (Signed)
 Emergency Medicine Provider Triage Evaluation Note  Terri Winters , a 68 y.o. female  was evaluated in triage.  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  Has been taking valacyclovir that is not prescribed to her, otherwise does not take any other prescriptions other than her normal prescriptions for blood pressure.  Does not have headache, does not complain of any neck pain, uncertain of length of time she was unconscious.  Does have noted cold sores on the upper lip..  Review of Systems  Positive: As above Negative:   Physical Exam  BP (!) 155/87 (BP Location: Left Arm)   Pulse 84   Temp 98.1 F (36.7 C) (Oral)   Resp 16   SpO2 99%  Gen:   Awake, no distress   Resp:  Normal effort  MSK:   Moves extremities without difficulty  Other:  Ambulatory without assistance, no dizziness elicited with standing position, neurovascular exam unremarkable with no focal neurodeficits appreciated and sensation intact in all 4 extremities.  Medical Decision Making  Medically screening exam initiated at 3:31 PM.  Appropriate orders placed.  Terri Winters was informed that the remainder of the evaluation will be completed by another provider, this initial triage assessment does not replace that evaluation, and the importance of remaining in the ED until their evaluation is complete.  Initial lab and imaging orders placed for syncope workup.   Terri Winters, Terri Winters 12/27/23 [phone]

## 2023-12-28 LAB — CK: Total CK: 99 U/L (ref 38–234)
